# Patient Record
Sex: Male | Born: 1970 | Race: White | Hispanic: No | Marital: Married | Smoking: Never smoker
Health system: Southern US, Community
[De-identification: ages and names within clinical notes are randomized; demographics above are authoritative.]

## PROBLEM LIST (undated history)

## (undated) DIAGNOSIS — E291 Testicular hypofunction: Secondary | ICD-10-CM

## (undated) DIAGNOSIS — M069 Rheumatoid arthritis, unspecified: Secondary | ICD-10-CM

## (undated) HISTORY — DX: Rheumatoid arthritis, unspecified: M06.9

## (undated) HISTORY — DX: Testicular hypofunction: E29.1

## (undated) HISTORY — PX: NO PAST SURGERIES: SHX2092

---

## 2013-05-28 ENCOUNTER — Emergency Department: Payer: Self-pay | Admitting: Emergency Medicine

## 2013-10-12 ENCOUNTER — Ambulatory Visit: Payer: Self-pay

## 2014-08-11 ENCOUNTER — Telehealth: Payer: Self-pay

## 2014-08-11 NOTE — Telephone Encounter (Signed)
Pt has not been seen since 07/2013. Pt pharmacy sent a refill request for fortesta. I do not see recent labs either. Please advise. Cw,lpn

## 2014-08-17 NOTE — Telephone Encounter (Signed)
Patient will need an office visit prior to refilling his for test. Patient's need to be seen on an annual basis for med refills.

## 2014-08-18 NOTE — Telephone Encounter (Signed)
Spoke with pt in reference to fortesta. Pt stated he previously had quit taking medication due to daughter and age. Pt has decided to start taking medication again. Made pt aware he would need an appt before any refills could be given. Pt stated he would call back to make appt.

## 2016-03-01 ENCOUNTER — Ambulatory Visit: Payer: Self-pay | Admitting: Urology

## 2016-03-14 NOTE — Progress Notes (Signed)
03/15/2016 9:06 AM   Nicholas Sweeney Jan 08, 1971 [id number]  Referring provider: No referring provider defined for this encounter.  Chief Complaint  Patient presents with  . Hypogonadism    last seen 07/2013    HPI: Patient is a 46 year old Caucasian male with a history of hypogonadism, erectile dysfunction and BPH with L UTS who presents today to reestablish care.  Hypogonadism Patient is experiencing a decrease in libido, a lack of energy, a decrease in strength, a decreased enjoyment in life, sadness and/or grumpiness, falling asleep after dinner and recent deterioration in their work performance.  This is indicated by his responses to the ADAM questionnaire.  He is still having spontaneous erections at night.   He does not have sleep apnea.   He had been on topical gels in the past, but he found them cumbersome.  He has not had any testosterone therapy in over two years.   He felt like he had more energy and drive when he was on the testosterone.       Androgen Deficiency in the Aging Male    Row Name 03/15/16 0800         Androgen Deficiency in the Aging Male   Do you have a decrease in libido (sex drive) Yes     Do you have lack of energy Yes     Do you have a decrease in strength and/or endurance Yes     Have you lost height No     Have you noticed a decreased "enjoyment of life" Yes     Are you sad and/or grumpy Yes     Are your erections less strong No     Have you noticed a recent deterioration in your ability to play sports No     Are you falling asleep after dinner Yes     Has there been a recent deterioration in your work performance Yes       Erectile dysfunction His SHIM score is 23, which is no ED.   His libido is diminished.   His risk factors for ED are age, BPH and hypogonadism  He denies any painful erections or curvatures with his erections.        SHIM    Row Name 03/15/16 0855         SHIM: Over the last 6 months:   How do you rate your  confidence that you could get and keep an erection? High     When you had erections with sexual stimulation, how often were your erections hard enough for penetration (entering your partner)? Almost Always or Always     During sexual intercourse, how often were you able to maintain your erection after you had penetrated (entered) your partner? Not Difficult     During sexual intercourse, how difficult was it to maintain your erection to completion of intercourse? Not Difficult     When you attempted sexual intercourse, how often was it satisfactory for you? Slightly Difficult       SHIM Total Score   SHIM 23        Score: 1-7 Severe ED 8-11 Moderate ED 12-16 Mild-Moderate ED 17-21 Mild ED 22-25 No ED    BPH WITH LUTS His IPSS score today is 2, which is mild lower urinary tract symptomatology.  He is delighted with his quality life due to his urinary symptoms.   He denies any dysuria, hematuria or suprapubic pain.   He also denies any recent fevers, chills,  nausea or vomiting.   He does not have a family history of PCa.      IPSS    Row Name 03/15/16 0800         International Prostate Symptom Score   How often have you had the sensation of not emptying your bladder? Not at All     How often have you had to urinate less than every two hours? Less than 1 in 5 times     How often have you found you stopped and started again several times when you urinated? Not at All     How often have you found it difficult to postpone urination? Not at All     How often have you had a weak urinary stream? Not at All     How often have you had to strain to start urination? Not at All     How many times did you typically get up at night to urinate? 1 Time     Total IPSS Score 2       Quality of Life due to urinary symptoms   If you were to spend the rest of your life with your urinary condition just the way it is now how would you feel about that? Delighted        Score:  1-7 Mild 8-19  Moderate 20-35 Severe    PMH: Past Medical History:  Diagnosis Date  . Hypogonadism in male   . RA (rheumatoid arthritis) (HCC)     Surgical History: History reviewed. No pertinent surgical history.  Home Medications:  Allergies as of 03/15/2016   No Known Allergies     Medication List       Accurate as of 03/15/16  9:06 AM. Always use your most recent med list.          meloxicam 15 MG tablet Commonly known as:  MOBIC Take 15 mg by mouth daily.       Allergies: No Known Allergies  Family History: Family History  Problem Relation Age of Onset  . Benign prostatic hyperplasia Father   . Prostate cancer Neg Hx   . Kidney cancer Neg Hx   . Bladder Cancer Neg Hx     Social History:  reports that he has never smoked. He has never used smokeless tobacco. He reports that he drinks alcohol. He reports that he does not use drugs.  ROS: UROLOGY Frequent Urination?: No Hard to postpone urination?: No Burning/pain with urination?: No Get up at night to urinate?: No Leakage of urine?: No Urine stream starts and stops?: No Trouble starting stream?: No Do you have to strain to urinate?: No Blood in urine?: No Urinary tract infection?: No Sexually transmitted disease?: No Injury to kidneys or bladder?: No Painful intercourse?: No Weak stream?: No Erection problems?: No Penile pain?: No  Gastrointestinal Nausea?: No Vomiting?: No Indigestion/heartburn?: No Diarrhea?: No Constipation?: No  Constitutional Fever: No Night sweats?: No Weight loss?: No Fatigue?: No  Skin Skin rash/lesions?: No Itching?: No  Eyes Blurred vision?: No Double vision?: No  Ears/Nose/Throat Sore throat?: No Sinus problems?: No  Hematologic/Lymphatic Swollen glands?: No Easy bruising?: No  Cardiovascular Leg swelling?: No Chest pain?: No  Respiratory Cough?: No Shortness of breath?: No  Endocrine Excessive thirst?: No  Musculoskeletal Back pain?: No Joint  pain?: No  Neurological Headaches?: No Dizziness?: No  Psychologic Depression?: No Anxiety?: No  Physical Exam: BP 120/73   Pulse 67   Ht 5\' 11"  (1.803 m)  Wt 179 lb 1.6 oz (81.2 kg)   BMI 24.98 kg/m   Constitutional: Well nourished. Alert and oriented, No acute distress. HEENT: [place] AT, moist mucus membranes. Trachea midline, no masses. Cardiovascular: No clubbing, cyanosis, or edema. Respiratory: Normal respiratory effort, no increased work of breathing. GI: Abdomen is soft, non tender, non distended, no abdominal masses. Liver and spleen not palpable.  No hernias appreciated.  Stool sample for occult testing is not indicated.   GU: No CVA tenderness.  No bladder fullness or masses.  Patient with circumcised phallus.  Urethral meatus is patent.  No penile discharge. No penile lesions or rashes. Scrotum without lesions, cysts, rashes and/or edema.  Testicles are located scrotally bilaterally. No masses are appreciated in the testicles. Left and right epididymis are normal. Rectal: Patient with  normal sphincter tone. Anus and perineum without scarring or rashes. No rectal masses are appreciated. Prostate is approximately 45 grams, no nodules are appreciated. Seminal vesicles are normal. Skin: No rashes, bruises or suspicious lesions. Lymph: No cervical or inguinal adenopathy. Neurologic: Grossly intact, no focal deficits, moving all 4 extremities. Psychiatric: Normal mood and affect.  Laboratory Data: Assessment & Plan:    1. Hypogonadism  - I explained to patient that the current recommendations from the Endocrine Society reports the diagnosis of hypogonadism requires a serum total testosterone level obtained between 8 and 10 AM at least 2 days apart that is below the laboratory parameters  for normal testosterone and even though he may have a history of hypogonadism, we will need to reestablish the diagnosis of hypogonadism  - At this time, the patient does not meet this  requirement.  He will return for another morning serum testosterones in two days before 10 AM   -I reviewed with the patient the side effects of testosterone therapy, such as: enlargement of the prostate gland that may in turn cause LUTS, possible increased risk of PCa, DVT's and/or PE's, possible increased risk of heart attack or stroke, lower sperm count, swelling of the ankles, feet, or body, with or without heart failure, enlarged or painful breasts, have problems breathing while you sleep (sleep apnea), increased prostate specific antigen, mood swings, hypertension and increased red blood cell count.  - I also discussed that some men have had success using clomid for hypogonadism.  It does seem to be more successful in younger men, but there are incidences of good results in middle-aged men.  I explained that it is used in male infertility to stimulate the testicles to make more testosterone/sperm.  There has been no long term data on side effects, but some urologists has been having success with this medication.   - He would like to start the Clomid if appropriate   2. Erectile dysfunction  - SHIM score is 23  - I explained to the patient that in order to achieve an erection it takes good functioning of the nervous system (parasympathetic, sympathetic, sensory and motor), good blood flow into the erectile tissue of the penis and a desire to have sex  - I explained that conditions like diabetes, hypertension, coronary artery disease, peripheral vascular disease, smoking, alcohol consumption, age, sleep apnea and BPH can diminish the ability to have an erection  - RTC in 6 months for repeat SHIM score and exam   3. BPH with LUTS  - IPSS score is 2/0  - Continue conservative management, avoiding bladder irritants and timed voiding's  - RTC in 6months for IPSS, PSA and exam  Return for patient will RTC on Friday before 10 AM for testosterone draw only.  These notes generated with voice  recognition software. I apologize for typographical errors.  Michiel Cowboy, PA-C  [facility] [address], [address] [place], [place] [address] [phone]

## 2016-03-15 ENCOUNTER — Ambulatory Visit (INDEPENDENT_AMBULATORY_CARE_PROVIDER_SITE_OTHER): Payer: BLUE CROSS/BLUE SHIELD | Admitting: Urology

## 2016-03-15 ENCOUNTER — Encounter: Payer: Self-pay | Admitting: Urology

## 2016-03-15 VITALS — BP 120/73 | HR 67 | Ht 71.0 in | Wt 179.1 lb

## 2016-03-15 DIAGNOSIS — E291 Testicular hypofunction: Secondary | ICD-10-CM

## 2016-03-15 DIAGNOSIS — N529 Male erectile dysfunction, unspecified: Secondary | ICD-10-CM

## 2016-03-15 DIAGNOSIS — N138 Other obstructive and reflux uropathy: Secondary | ICD-10-CM

## 2016-03-15 DIAGNOSIS — N401 Enlarged prostate with lower urinary tract symptoms: Secondary | ICD-10-CM

## 2016-03-16 ENCOUNTER — Telehealth: Payer: Self-pay

## 2016-03-16 DIAGNOSIS — E291 Testicular hypofunction: Secondary | ICD-10-CM

## 2016-03-16 LAB — HEPATIC FUNCTION PANEL
ALT: 21 IU/L (ref 0–44)
AST: 17 IU/L (ref 0–40)
Albumin: 4.7 g/dL (ref 3.5–5.5)
Alkaline Phosphatase: 80 IU/L (ref 39–117)
BILIRUBIN TOTAL: 0.4 mg/dL (ref 0.0–1.2)
BILIRUBIN, DIRECT: 0.12 mg/dL (ref 0.00–0.40)
TOTAL PROTEIN: 7.3 g/dL (ref 6.0–8.5)

## 2016-03-16 LAB — PSA: Prostate Specific Ag, Serum: 0.7 ng/mL (ref 0.0–4.0)

## 2016-03-16 LAB — TESTOSTERONE: Testosterone: 291 ng/dL (ref 264–916)

## 2016-03-16 LAB — HEMATOCRIT: HEMATOCRIT: 43.7 % (ref 37.5–51.0)

## 2016-03-16 NOTE — Telephone Encounter (Signed)
-----   Message from Harle BattiestShannon A McGowan, PA-C sent at 03/16/2016  8:08 AM EST ----- Please notify the patient that his labs were normal.  His testosterone is also in the normal range.  He has a second testosterone draw scheduled and we will wait to see what that shows.

## 2016-03-16 NOTE — Telephone Encounter (Signed)
Spoke with pt in reference to lab results. Pt voiced understanding. Pt has a lab appt on Friday.

## 2016-03-18 ENCOUNTER — Other Ambulatory Visit: Payer: BLUE CROSS/BLUE SHIELD

## 2016-03-18 DIAGNOSIS — E291 Testicular hypofunction: Secondary | ICD-10-CM

## 2016-03-19 LAB — TESTOSTERONE: Testosterone: 204 ng/dL — ABNORMAL LOW (ref 264–916)

## 2016-03-21 ENCOUNTER — Telehealth: Payer: Self-pay

## 2016-03-21 ENCOUNTER — Other Ambulatory Visit: Payer: Self-pay

## 2016-03-21 DIAGNOSIS — E291 Testicular hypofunction: Secondary | ICD-10-CM

## 2016-03-21 NOTE — Telephone Encounter (Signed)
Patient notified of message and scheduled for a testosterone tomorrow at 8:30 am, order placed/SW

## 2016-03-21 NOTE — Telephone Encounter (Signed)
-----   Message from Harle BattiestShannon A McGowan, PA-C sent at 03/20/2016 10:39 AM EST ----- Please notify the patient that his second testosterone did result below the laboratories parameter for normal testosterone.  He would like to get another testosterone before 10 AM to see if this to is below the laboratories parameter for normal testosterone.

## 2016-03-22 ENCOUNTER — Other Ambulatory Visit: Payer: BLUE CROSS/BLUE SHIELD

## 2016-03-22 DIAGNOSIS — E291 Testicular hypofunction: Secondary | ICD-10-CM

## 2016-03-23 ENCOUNTER — Telehealth: Payer: Self-pay

## 2016-03-23 ENCOUNTER — Telehealth: Payer: Self-pay | Admitting: Urology

## 2016-03-23 DIAGNOSIS — E291 Testicular hypofunction: Secondary | ICD-10-CM

## 2016-03-23 LAB — TESTOSTERONE: Testosterone: 255 ng/dL — ABNORMAL LOW (ref 264–916)

## 2016-03-23 MED ORDER — CLOMIPHENE CITRATE 50 MG PO TABS: ORAL_TABLET | ORAL | 0 refills | Status: DC

## 2016-03-23 NOTE — Telephone Encounter (Signed)
Pt wife called stating that she works at [employer] and wanted to know why a PA was not completed for pt to receive clomid. Reinforced with wife that PA would/could be completed but was making pt aware of cash price from [facility]. Wife then inquired about why pt could not get another form of testosterone. Reinforced with wife that it is up to [facility] as if pt is able to receive other forms of testosterone but according to dictation he elected to try the clomid. Reinforced with wife that if another form of testosterone was chosen a PA would be completed but there was not guarantee insurance would cover medication. Wife then inquired about why pt would need to come into office for injections. Made wife aware that due to FDA regulations on testosterone and being a controlled substance. Wife then stated well percocets are also controlled substances and wanted to know if we made pts come in to take their pills. Made pt aware that we have adopted this policy as a BUA policy. Wife then stated "ok so its insurance fraud!" Wife then hung up.

## 2016-03-23 NOTE — Telephone Encounter (Signed)
Pt said Clomid was not covered by insurance.  Please give pt a call.  [phone]

## 2016-03-23 NOTE — Telephone Encounter (Signed)
I really don't understand Mrs. Stettner concerns as I had sent the prescription in for her husband this morning and I have not received a PA request for this medication.  Also, I had discussed with her husband the different types of treatments for testosterone and he was interested in trying Clomid therapy.

## 2016-03-23 NOTE — Telephone Encounter (Signed)
Spoke with pt in reference to clomid. Made pt aware of [facility] pharmacy prices. Pt inquired about other forms of testosterone. Please advise.

## 2016-03-23 NOTE — Telephone Encounter (Signed)
Spoke with pt in reference to lab results. Pt elected to start clomid. Medication was sent to pharmacy. Made pt aware will need labs after 27mo of medication. Pt voiced understanding.

## 2016-03-23 NOTE — Telephone Encounter (Signed)
As discussed during our office visit, Clomid is not testosterone.  For a quick explanation, it stimulates sperm and testosterone production in your testicles.   I have prescribed this medication for several men over the last ten years and had good results.  The advantage of this medication is that it uses your own testicles to make testosterone.  The disadvantage is if your testicles are "used up" the Clomid will not be effective.  There are gels, patches, short acting injections, long acting injections, buccal systems, nasal spray and pellets.   The side effects of all these therapies are they suppress the body's natural testosterone production, enlargement of the prostate gland that may in turn cause LUTS, possible increased risk of PCa, DVT's and/or PE's, possible increased risk of heart attack or stroke, lower sperm count, swelling of the ankles, feet, or body, with or without heart failure, enlarged or painful breasts, have problems breathing while you sleep (sleep apnea), increased prostate specific antigen, mood swings, hypertension and increased red blood cell count.  Specifically, the gels and patches have the risk of transferring testosterone to women and children's when they come into contact with your skin.  The injections of testosterone in my clinical experience have cysts patient's hematocrit at a greater rate than the other treatment modalities causing the patient to have to stop testosterone therapy or undergo therapeutic phlebotomy.  To be honest the buccal systems was not well received and I have not prescribed this treatment modality in several years. I have very few individuals on the nasal spray, but it does have the advantage in some gentlemen where topicals have not been effective as it bypasses the skin.  I do have a fair bit of men on the pellets as it is convenient treatment. It bypasses the skin metabolism and men do not have to think about applying the medication on a daily basis.   There are 6 pellets the size of the tic tacks placed into the fat tissue in the upper corner of the hip on a schedule of every 90 days.  But ultimately, I have found that testosterone treatment is insurance driven and so it is dependent upon the patient's insurance coverage and formulary preferences to which type of testosterone treatment and they receive.

## 2016-03-23 NOTE — Telephone Encounter (Signed)
-----   Message from Harle BattiestShannon A McGowan, PA-C sent at 03/23/2016  8:11 AM EST ----- Please notify the patient that his second testosterone is below the laboratory parameters for normal.   Does he want to start Clomid at this time?

## 2016-03-24 MED ORDER — TESTOSTERONE CYPIONATE 200 MG/ML IM SOLN: 200.0000 mg | INTRAMUSCULAR | 0 refills | Status: DC

## 2016-03-24 NOTE — Telephone Encounter (Signed)
Okay to call in script for testosterone cypionate, 200mg /1 mL, 200 mg IM every two weeks.  Recheck testosterone one week after fourth injection.  Schedule injections on nurse schedule.

## 2016-03-24 NOTE — Telephone Encounter (Signed)
Pt returned the call apologizing for the misunderstanding. Pt elected to move forward with injections. See previous note.

## 2016-03-24 NOTE — Telephone Encounter (Signed)
Pt called back in reference to forms of testosterone. Pt stated that she is not sure the clomid is going to work the way he would like for it to. Therefore he would like to have the injections. Reinforced with pt the possible side effects of injections. Pt voiced understanding. Please advise.

## 2016-03-24 NOTE — Telephone Encounter (Signed)
Medication sent to pharmacy. Will complete PA once its received.

## 2016-03-30 ENCOUNTER — Encounter: Payer: Self-pay | Admitting: Urology

## 2016-04-06 NOTE — Telephone Encounter (Signed)
Pt's wife, Rodney Boozeasha, called this morning asking about the prior authorization for testosterone shots.  Please give her a call [phone].

## 2016-04-07 NOTE — Telephone Encounter (Signed)
Spoke to patient's wife, Rodney Boozeasha [phone]), regarding the prescription for testosterone.  She expressed her concerns as to why it was taking so long for him to get his prescription.  I confirmed that the prescription [facility] [place] on [facility].    I contacted the pharmacy and they ran the prescription thru insurance and it processed for $10.  They will prepare the prescription for the patient to pick up.    I called the patient's wife back and left a message for the patient's wife regarding the prescription.  I advised her to call back to the office to schedule nurse visits for testosterone injections per Shannon's instructions.

## 2016-04-08 ENCOUNTER — Ambulatory Visit (INDEPENDENT_AMBULATORY_CARE_PROVIDER_SITE_OTHER): Payer: BLUE CROSS/BLUE SHIELD

## 2016-04-08 DIAGNOSIS — E291 Testicular hypofunction: Secondary | ICD-10-CM | POA: Diagnosis not present

## 2016-04-08 MED ORDER — TESTOSTERONE CYPIONATE 200 MG/ML IM SOLN
200.0000 mg | Freq: Once | INTRAMUSCULAR | Status: AC
  Administered 2016-04-08: 200 mg via INTRAMUSCULAR

## 2016-04-08 NOTE — Progress Notes (Signed)
Testosterone IM Injection  Due to Hypogonadism patient is present today for a Testosterone Injection.  Medication: Testosterone Cypionate Dose: 1mL Location: right upper outer buttocks Lot: [id number] Exp:12/2017  Patient tolerated well, no complications were noted  Preformed by: Marquinn Meschke, LPN   Follow up: 2 weeks  

## 2016-04-22 ENCOUNTER — Ambulatory Visit (INDEPENDENT_AMBULATORY_CARE_PROVIDER_SITE_OTHER): Payer: BLUE CROSS/BLUE SHIELD

## 2016-04-22 DIAGNOSIS — E291 Testicular hypofunction: Secondary | ICD-10-CM | POA: Diagnosis not present

## 2016-04-22 MED ORDER — TESTOSTERONE CYPIONATE 200 MG/ML IM SOLN
200.0000 mg | Freq: Once | INTRAMUSCULAR | Status: AC
  Administered 2016-04-22: 200 mg via INTRAMUSCULAR

## 2016-04-22 NOTE — Progress Notes (Signed)
Testosterone IM Injection  Due to Hypogonadism patient is present today for a Testosterone Injection.  Medication: Testosterone Cypionate Dose: 1mL Location: left upper outer buttocks Lot: [id number] Exp:12/2017  Patient tolerated well, no complications were noted  Preformed by: Linh Johannes, LPN   Follow up: 2 weeks  

## 2016-05-04 ENCOUNTER — Ambulatory Visit (INDEPENDENT_AMBULATORY_CARE_PROVIDER_SITE_OTHER): Payer: BLUE CROSS/BLUE SHIELD

## 2016-05-04 DIAGNOSIS — E291 Testicular hypofunction: Secondary | ICD-10-CM | POA: Diagnosis not present

## 2016-05-04 MED ORDER — TESTOSTERONE CYPIONATE 200 MG/ML IM SOLN
200.0000 mg | Freq: Once | INTRAMUSCULAR | Status: AC
  Administered 2016-05-04: 200 mg via INTRAMUSCULAR

## 2016-05-04 NOTE — Progress Notes (Signed)
Testosterone IM Injection  Due to Hypogonadism patient is present today for a Testosterone Injection.  Medication: Testosterone Cypionate Dose: 1mL Location: right upper outer buttocks Lot: [id number] Exp:12/2017  Patient tolerated well, no complications were noted  Preformed by: Chelsea Watkins, LPN   Follow up: 2 weeks  

## 2016-05-20 ENCOUNTER — Ambulatory Visit (INDEPENDENT_AMBULATORY_CARE_PROVIDER_SITE_OTHER): Payer: BLUE CROSS/BLUE SHIELD

## 2016-05-20 DIAGNOSIS — E291 Testicular hypofunction: Secondary | ICD-10-CM | POA: Diagnosis not present

## 2016-05-20 MED ORDER — TESTOSTERONE CYPIONATE 200 MG/ML IM SOLN
200.0000 mg | Freq: Once | INTRAMUSCULAR | Status: AC
  Administered 2016-05-20: 200 mg via INTRAMUSCULAR

## 2016-05-20 NOTE — Progress Notes (Signed)
Testosterone IM Injection  Due to Hypogonadism patient is present today for a Testosterone Injection.  Medication: Testosterone Cypionate Dose: 1mL Location: left upper outer buttocks Lot: [id number] Exp:12/2017  Patient tolerated well, no complications were noted  Preformed by: Rupert Stacks, LPN   Follow up: pt will have labs next week

## 2016-05-27 ENCOUNTER — Other Ambulatory Visit: Payer: Self-pay

## 2016-05-27 ENCOUNTER — Other Ambulatory Visit: Payer: BLUE CROSS/BLUE SHIELD

## 2016-05-27 DIAGNOSIS — E291 Testicular hypofunction: Secondary | ICD-10-CM

## 2016-05-28 LAB — TESTOSTERONE: Testosterone: 667 ng/dL (ref 264–916)

## 2016-05-30 ENCOUNTER — Telehealth: Payer: Self-pay

## 2016-05-30 NOTE — Telephone Encounter (Signed)
Spoke with pt in reference to lab results. Pt voiced understanding.  

## 2016-05-30 NOTE — Telephone Encounter (Signed)
-----   Message from Harle Battiest, PA-C sent at 05/28/2016 11:22 AM EDT ----- Please let Mr. Domagalski know that his testosterone level is excellent at 667.  We will continue with testosterone cypionate 200 mg IM every 2 weeks.  I will need to see him in five months for ADAM, I PSS, SHIM and exam.  He will need a testosterone (one week after the injection), HCT, HBG, PSA to be drawn before appointment

## 2016-06-03 ENCOUNTER — Ambulatory Visit (INDEPENDENT_AMBULATORY_CARE_PROVIDER_SITE_OTHER): Payer: BLUE CROSS/BLUE SHIELD

## 2016-06-03 DIAGNOSIS — E291 Testicular hypofunction: Secondary | ICD-10-CM | POA: Diagnosis not present

## 2016-06-03 MED ORDER — TESTOSTERONE CYPIONATE 200 MG/ML IM SOLN
200.0000 mg | Freq: Once | INTRAMUSCULAR | Status: AC
  Administered 2016-06-03: 200 mg via INTRAMUSCULAR

## 2016-06-03 NOTE — Progress Notes (Signed)
Testosterone IM Injection  Due to Hypogonadism patient is present today for a Testosterone Injection.  Medication: Testosterone Cypionate Dose: 1mL Location: right upper outer buttocks Lot: [id number] Exp:12/2017  Patient tolerated well, no complications were noted  Preformed by: Rupert Stackshelsea Armentha Branagan, LPN   Follow up: 2 weeks

## 2016-06-10 ENCOUNTER — Other Ambulatory Visit: Payer: BLUE CROSS/BLUE SHIELD

## 2016-06-17 ENCOUNTER — Ambulatory Visit (INDEPENDENT_AMBULATORY_CARE_PROVIDER_SITE_OTHER): Payer: BLUE CROSS/BLUE SHIELD

## 2016-06-17 DIAGNOSIS — E291 Testicular hypofunction: Secondary | ICD-10-CM

## 2016-06-17 MED ORDER — TESTOSTERONE CYPIONATE 200 MG/ML IM SOLN
200.0000 mg | Freq: Once | INTRAMUSCULAR | Status: AC
  Administered 2016-06-17: 200 mg via INTRAMUSCULAR

## 2016-06-17 NOTE — Progress Notes (Signed)
Testosterone IM Injection  Due to Hypogonadism patient is present today for a Testosterone Injection.  Medication: Testosterone Cypionate Dose: 1mL Location: left upper outer buttocks Lot: [id number] Exp:12/2017  Patient tolerated well, no complications were noted  Preformed by: Rupert Stackshelsea Watkins, LPN   Follow up: 2 weeks

## 2016-07-01 ENCOUNTER — Ambulatory Visit (INDEPENDENT_AMBULATORY_CARE_PROVIDER_SITE_OTHER): Payer: BLUE CROSS/BLUE SHIELD

## 2016-07-01 DIAGNOSIS — E291 Testicular hypofunction: Secondary | ICD-10-CM

## 2016-07-01 MED ORDER — TESTOSTERONE CYPIONATE 200 MG/ML IM SOLN
200.0000 mg | Freq: Once | INTRAMUSCULAR | Status: AC
  Administered 2016-07-01: 200 mg via INTRAMUSCULAR

## 2016-07-01 NOTE — Progress Notes (Signed)
Testosterone IM Injection  Due to Hypogonadism patient is present today for a Testosterone Injection.  Medication: Testosterone Cypionate Dose: 1ml Location: left upper outer buttocks Lot: [id number]  Exp:01/2018  Patient tolerated well, no complications were noted  Preformed by: C.Taquilla Downum, CMA  Follow up: 2 weeks 

## 2016-07-15 ENCOUNTER — Ambulatory Visit (INDEPENDENT_AMBULATORY_CARE_PROVIDER_SITE_OTHER): Payer: BLUE CROSS/BLUE SHIELD | Admitting: *Deleted

## 2016-07-15 VITALS — BP 118/77 | HR 58 | Wt 181.0 lb

## 2016-07-15 DIAGNOSIS — E291 Testicular hypofunction: Secondary | ICD-10-CM | POA: Diagnosis not present

## 2016-07-15 MED ORDER — TESTOSTERONE CYPIONATE 200 MG/ML IM SOLN
200.0000 mg | Freq: Once | INTRAMUSCULAR | Status: AC
  Administered 2016-07-15: 200 mg via INTRAMUSCULAR

## 2016-07-15 NOTE — Progress Notes (Signed)
Testosterone IM Injection  Due to Hypogonadism patient is present today for a Testosterone Injection.  Medication: Testosterone Cypionate Dose: 1 ml  200mg  Location: right upper outer buttocks Lot: [id number] Exp:01/2018  Patient tolerated well, no complications were noted.  Preformed by: Ashley Marineramona William CMA  Follow up: 2 weeks

## 2016-07-29 ENCOUNTER — Ambulatory Visit (INDEPENDENT_AMBULATORY_CARE_PROVIDER_SITE_OTHER): Payer: BLUE CROSS/BLUE SHIELD

## 2016-07-29 DIAGNOSIS — E291 Testicular hypofunction: Secondary | ICD-10-CM | POA: Diagnosis not present

## 2016-07-29 MED ORDER — TESTOSTERONE CYPIONATE 200 MG/ML IM SOLN
200.0000 mg | Freq: Once | INTRAMUSCULAR | Status: AC
  Administered 2016-07-29: 200 mg via INTRAMUSCULAR

## 2016-07-29 NOTE — Progress Notes (Signed)
Testosterone IM Injection  Due to Hypogonadism patient is present today for a Testosterone Injection.  Medication: Testosterone Cypionate Dose: 1 ml Location: left upper outer buttocks Lot: [id number] Exp:01/2018  Patient tolerated well, no complications were noted  Performed by: C. Rana SnareLowe, CMA  Follow up: 2 weeks

## 2016-08-12 ENCOUNTER — Ambulatory Visit (INDEPENDENT_AMBULATORY_CARE_PROVIDER_SITE_OTHER): Payer: BLUE CROSS/BLUE SHIELD | Admitting: *Deleted

## 2016-08-12 DIAGNOSIS — E291 Testicular hypofunction: Secondary | ICD-10-CM | POA: Diagnosis not present

## 2016-08-12 MED ORDER — TESTOSTERONE CYPIONATE 200 MG/ML IM SOLN
200.0000 mg | Freq: Once | INTRAMUSCULAR | Status: AC
  Administered 2016-08-12: 200 mg via INTRAMUSCULAR

## 2016-08-12 NOTE — Progress Notes (Signed)
Testosterone IM Injection  Due to Hypogonadism patient is present today for a Testosterone Injection.  Medication: Testosterone Cypionate Dose: 1ml Location: right upper outer buttocks Lot: [id number] Exp:01/2018  Patient tolerated well, no complications were noted.  Preformed by: Dallas Schimkeamona Williams CMA  Follow up: 2 weeks

## 2016-08-23 ENCOUNTER — Other Ambulatory Visit: Payer: Self-pay | Admitting: Urology

## 2016-08-23 DIAGNOSIS — E291 Testicular hypofunction: Secondary | ICD-10-CM

## 2016-08-26 ENCOUNTER — Ambulatory Visit (INDEPENDENT_AMBULATORY_CARE_PROVIDER_SITE_OTHER): Payer: BLUE CROSS/BLUE SHIELD

## 2016-08-26 DIAGNOSIS — E291 Testicular hypofunction: Secondary | ICD-10-CM

## 2016-08-26 MED ORDER — TESTOSTERONE CYPIONATE 200 MG/ML IM SOLN
200.0000 mg | Freq: Once | INTRAMUSCULAR | Status: AC
  Administered 2016-08-26: 200 mg via INTRAMUSCULAR

## 2016-08-26 NOTE — Progress Notes (Signed)
Testosterone IM Injection  Due to Hypogonadism patient is present today for a Testosterone Injection.  Medication: Testosterone Cypionate Dose: 1 ml Location: left upper outer buttocks Lot: [id number] Exp:01/2018  Patient tolerated well, no complications were noted  Preformed by: C. Myda Detwiler, CMA   Follow up: 2 weeks  

## 2016-09-09 ENCOUNTER — Ambulatory Visit (INDEPENDENT_AMBULATORY_CARE_PROVIDER_SITE_OTHER): Payer: BLUE CROSS/BLUE SHIELD | Admitting: *Deleted

## 2016-09-09 DIAGNOSIS — E291 Testicular hypofunction: Secondary | ICD-10-CM | POA: Diagnosis not present

## 2016-09-09 MED ORDER — TESTOSTERONE CYPIONATE 200 MG/ML IM SOLN
200.0000 mg | Freq: Once | INTRAMUSCULAR | Status: AC
  Administered 2016-09-09: 200 mg via INTRAMUSCULAR

## 2016-09-09 NOTE — Progress Notes (Signed)
Testosterone IM Injection  Due to Hypogonadism patient is present today for a Testosterone Injection.  Medication: Testosterone Cypionate Dose: 1ml Location: right upper outer buttocks Lot: [id number] Exp:01/2018  Patient tolerated well, no complications were noted  Preformed by: Dallas Schimkeamona Jessicamarie Amiri CMA  Follow up: 2 weeks

## 2016-09-20 ENCOUNTER — Telehealth: Payer: Self-pay | Admitting: Urology

## 2016-09-20 ENCOUNTER — Ambulatory Visit (INDEPENDENT_AMBULATORY_CARE_PROVIDER_SITE_OTHER): Payer: BLUE CROSS/BLUE SHIELD

## 2016-09-20 DIAGNOSIS — E291 Testicular hypofunction: Secondary | ICD-10-CM

## 2016-09-20 MED ORDER — TESTOSTERONE CYPIONATE 200 MG/ML IM SOLN
200.0000 mg | Freq: Once | INTRAMUSCULAR | Status: AC
  Administered 2016-09-20: 200 mg via INTRAMUSCULAR

## 2016-09-20 NOTE — Telephone Encounter (Signed)
Spoke with pt in reference to needing labs and OV prior to next injection. Pt voiced understanding. Pt stated he is at work and will call back when he gets off. Lab orders placed.

## 2016-09-20 NOTE — Progress Notes (Signed)
Testosterone IM Injection  Due to Hypogonadism patient is present today for a Testosterone Injection.  Medication: Testosterone Cypionate Dose: 62mL Location: left upper outer buttocks Lot: [id number] Exp:01/2018  Patient tolerated well, no complications were noted  Preformed by: Rupert Stacks, LPN   Follow up: 2 weeks

## 2016-09-20 NOTE — Telephone Encounter (Signed)
Mr. Denno will need an appointment with me prior to his next injection.  Testosterone, HCT/HBG, PSA to be drawn before appointment

## 2016-09-23 ENCOUNTER — Ambulatory Visit: Payer: BLUE CROSS/BLUE SHIELD

## 2016-10-07 ENCOUNTER — Ambulatory Visit (INDEPENDENT_AMBULATORY_CARE_PROVIDER_SITE_OTHER): Payer: BLUE CROSS/BLUE SHIELD

## 2016-10-07 DIAGNOSIS — E291 Testicular hypofunction: Secondary | ICD-10-CM | POA: Diagnosis not present

## 2016-10-07 MED ORDER — TESTOSTERONE CYPIONATE 200 MG/ML IM SOLN
200.0000 mg | Freq: Once | INTRAMUSCULAR | Status: AC
  Administered 2016-10-07: 200 mg via INTRAMUSCULAR

## 2016-10-07 NOTE — Progress Notes (Signed)
Testosterone IM Injection  Due to Hypogonadism patient is present today for a Testosterone Injection.  Medication: Testosterone Cypionate Dose: 1mL Location: right upper outer buttocks Lot: [id number] Exp:01/2018  Patient tolerated well, no complications were noted  Preformed by: Rupert Stackshelsea Watkins, LPN   Follow up: pt set up lab appt for next week and OV with Carollee HerterShannon in 2 weeks

## 2016-10-14 ENCOUNTER — Other Ambulatory Visit: Payer: BLUE CROSS/BLUE SHIELD

## 2016-10-14 ENCOUNTER — Ambulatory Visit: Payer: BLUE CROSS/BLUE SHIELD

## 2016-10-14 DIAGNOSIS — E291 Testicular hypofunction: Secondary | ICD-10-CM

## 2016-10-15 LAB — PSA: Prostate Specific Ag, Serum: 1 ng/mL (ref 0.0–4.0)

## 2016-10-15 LAB — TESTOSTERONE: Testosterone: 574 ng/dL (ref 264–916)

## 2016-10-15 LAB — HEMATOCRIT: HEMATOCRIT: 45.4 % (ref 37.5–51.0)

## 2016-10-15 LAB — HEMOGLOBIN: Hemoglobin: 16 g/dL (ref 13.0–17.7)

## 2016-10-18 NOTE — Progress Notes (Signed)
10/19/2016 8:53 AM   Nicholas Sweeney [birth year] [id number]  Referring provider: No referring provider defined for this encounter.  Chief Complaint  Patient presents with  . Hypogonadism    2 month follow up    HPI: Patient is a 46 year old Caucasian male with a history of testosterone deficiency, erectile dysfunction and BPH with L UTS who presents today for 6 month follow-up.  Testosterone deficiency Patient is not experiencing a decrease in libido, a lack of energy, a decrease in strength, all loss of height, a decreased enjoyment in life, sadness and/or grumpiness, his erections being less strong, a decrease in his ability to play sports, falling asleep after dinner and recent deterioration in their work performance.  This is indicated by his responses to the ADAM questionnaire.  He is still having spontaneous erections at night.   He does not have sleep apnea.   He had been on topical gels in the past, but he found them cumbersome.  His current testosterone level is 574 ng/dL on 40/98/1191. His hematocrit and hemoglobin are normal. He is currently managing his testosterone deficiency with testosterone cypionate 200 mg/cc, 1 mL every 2 weeks.       Androgen Deficiency in the Aging Male    Row Name 10/19/16 0800         Androgen Deficiency in the Aging Male   Do you have a decrease in libido (sex drive) No     Do you have lack of energy No     Do you have a decrease in strength and/or endurance No     Have you lost height No     Have you noticed a decreased "enjoyment of life" No     Are you sad and/or grumpy No     Are your erections less strong No     Have you noticed a recent deterioration in your ability to play sports No     Are you falling asleep after dinner No     Has there been a recent deterioration in your work performance No       Erectile dysfunction His SHIM score is 24, which is no ED.   His previous SHIM score was 23.  His libido is diminished.   His  risk factors for ED are age, BPH and hypogonadism  He denies any painful erections or curvatures with his erections.        SHIM    Row Name 10/19/16 0845         SHIM: Over the last 6 months:   How do you rate your confidence that you could get and keep an erection? High     When you had erections with sexual stimulation, how often were your erections hard enough for penetration (entering your partner)? Almost Always or Always     During sexual intercourse, how often were you able to maintain your erection after you had penetrated (entered) your partner? Almost Always or Always     During sexual intercourse, how difficult was it to maintain your erection to completion of intercourse? Not Difficult     When you attempted sexual intercourse, how often was it satisfactory for you? Almost Always or Always       SHIM Total Score   SHIM 24        Score: 1-7 Severe ED 8-11 Moderate ED 12-16 Mild-Moderate ED 17-21 Mild ED 22-25 No ED    BPH WITH LUTS His IPSS score today is 3, which is  mild lower urinary tract symptomatology.  He is delighted with his quality life due to his urinary symptoms.   His previous I PSS score was 2/0.  He denies any dysuria, hematuria or suprapubic pain.   He also denies any recent fevers, chills, nausea or vomiting.   He does not have a family history of PCa.      IPSS    Row Name 10/19/16 0800         International Prostate Symptom Score   How often have you had the sensation of not emptying your bladder? Not at All     How often have you had to urinate less than every two hours? Less than 1 in 5 times     How often have you found you stopped and started again several times when you urinated? Not at All     How often have you found it difficult to postpone urination? Not at All     How often have you had a weak urinary stream? Not at All     How often have you had to strain to start urination? Not at All     How many times did you typically get up  at night to urinate? 2 Times     Total IPSS Score 3       Quality of Life due to urinary symptoms   If you were to spend the rest of your life with your urinary condition just the way it is now how would you feel about that? Delighted        Score:  1-7 Mild 8-19 Moderate 20-35 Severe    PMH: Past Medical History:  Diagnosis Date  . Hypogonadism in male   . RA (rheumatoid arthritis) (HCC)     Surgical History: History reviewed. No pertinent surgical history.  Home Medications:  Allergies as of 10/19/2016      Reactions   No Known Allergies       Medication List       Accurate as of 10/19/16  8:53 AM. Always use your most recent med list.          azithromycin 250 MG tablet Commonly known as:  ZITHROMAX azithromycin 250 mg tablet   cefdinir 300 MG capsule Commonly known as:  OMNICEF cefdinir 300 mg capsule   clindamycin 300 MG capsule Commonly known as:  CLEOCIN clindamycin HCl 300 mg capsule   clomiPHENE 50 MG tablet Commonly known as:  CLOMID 1/2 tab daily   levofloxacin 500 MG tablet Commonly known as:  LEVAQUIN levofloxacin 500 mg tablet   meloxicam 15 MG tablet Commonly known as:  MOBIC Take 15 mg by mouth daily.   predniSONE 10 MG (21) Tbpk tablet Commonly known as:  STERAPRED UNI-PAK 21 TAB prednisone 10 mg tablets in a dose pack   Testosterone 10 MG/ACT (2%) Gel testosterone 10 mg/0.5 gram/actuation transdermal gel pump   testosterone cypionate 200 MG/ML injection Commonly known as:  DEPOTESTOSTERONE CYPIONATE INJECT 1 ML INTO THE MUSCLE EVERY 14 DAYS       Allergies:  Allergies  Allergen Reactions  . No Known Allergies     Family History: Family History  Problem Relation Age of Onset  . Benign prostatic hyperplasia Father   . Prostate cancer Neg Hx   . Kidney cancer Neg Hx   . Bladder Cancer Neg Hx     Social History:  reports that he has never smoked. He has never used smokeless tobacco. He reports that he  drinks  alcohol. He reports that he does not use drugs.  ROS: UROLOGY Frequent Urination?: No Hard to postpone urination?: No Burning/pain with urination?: No Get up at night to urinate?: No Leakage of urine?: No Urine stream starts and stops?: No Trouble starting stream?: No Do you have to strain to urinate?: No Blood in urine?: No Urinary tract infection?: No Sexually transmitted disease?: No Injury to kidneys or bladder?: No Painful intercourse?: No Weak stream?: No Erection problems?: No Penile pain?: No  Gastrointestinal Nausea?: No Vomiting?: No Indigestion/heartburn?: No Diarrhea?: No Constipation?: No  Constitutional Fever: No Night sweats?: No Weight loss?: No Fatigue?: No  Skin Skin rash/lesions?: No Itching?: No  Eyes Blurred vision?: No Double vision?: No  Ears/Nose/Throat Sore throat?: No Sinus problems?: No  Hematologic/Lymphatic Swollen glands?: No Easy bruising?: No  Cardiovascular Leg swelling?: No Chest pain?: No  Respiratory Cough?: No Shortness of breath?: No  Endocrine Excessive thirst?: No  Musculoskeletal Back pain?: No Joint pain?: No  Neurological Headaches?: No Dizziness?: No  Psychologic Depression?: No Anxiety?: No  Physical Exam: BP 126/84   Pulse 66   Ht  (1.803 m)   Wt 183 lb 3.2 oz (83.1 kg)   BMI 25.55 kg/m   Constitutional: Well nourished. Alert and oriented, No acute distress. HEENT: [place] AT, moist mucus membranes. Trachea midline, no masses. Cardiovascular: No clubbing, cyanosis, or edema. Respiratory: Normal respiratory effort, no increased work of breathing. GI: Abdomen is soft, non tender, non distended, no abdominal masses. Liver and spleen not palpable.  No hernias appreciated.  Stool sample for occult testing is not indicated.   GU: No CVA tenderness.  No bladder fullness or masses.  Patient with circumcised phallus.  Urethral meatus is patent.  No penile discharge. No penile lesions or  rashes. Scrotum without lesions, cysts, rashes and/or edema.  Testicles are located scrotally bilaterally. No masses are appreciated in the testicles. Left and right epididymis are normal. Rectal: Patient with  normal sphincter tone. Anus and perineum without scarring or rashes. No rectal masses are appreciated. Prostate is approximately 45 grams, no nodules are appreciated. Seminal vesicles are normal. Skin: No rashes, bruises or suspicious lesions. Lymph: No cervical or inguinal adenopathy. Neurologic: Grossly intact, no focal deficits, moving all 4 extremities. Psychiatric: Normal mood and affect.  Laboratory Data: PSA History  0.7 ng/mL on 03/15/2016  1.0 ng/mL on 10/14/2016     Assessment & Plan:    1. Testosterone deficiency   -most recent testosterone level is 574 ng/dL on 40/98/1191 (goal 478-295 ng/dL)  -continue testosterone cypionate 200 mg, 1 mL IM Q2 weeks  -RTC in 6 months for HCT/HBG, testosterone, ADAM and exam  2. BPH with LUTS  - IPSS score is 3/0, it is worsening  - Continue conservative management, avoiding bladder irritants and timed voiding's  - RTC in 6 months for IPSS, PSA and exam, as testosterone therapy can cause prostate enlargement and worsen LUTS  3. Erectile dysfunction:     -SHIM score is 24, it is improved  - RTC in 6 months for SHIM score and exam, as testosterone therapy can affect erections    Return in about 6 months (around 04/18/2017) for PSA,Testoterone and  HCT/HBG, ADAM, IPSS, SHIM and exam.  These notes generated with voice recognition software. I apologize for typographical errors.  Michiel Cowboy, PA-C  [facility] [address] [place], [place] [address] [phone]

## 2016-10-19 ENCOUNTER — Ambulatory Visit (INDEPENDENT_AMBULATORY_CARE_PROVIDER_SITE_OTHER): Payer: BLUE CROSS/BLUE SHIELD | Admitting: Urology

## 2016-10-19 ENCOUNTER — Encounter: Payer: Self-pay | Admitting: Urology

## 2016-10-19 VITALS — BP 126/84 | HR 66 | Ht 71.0 in | Wt 183.2 lb

## 2016-10-19 DIAGNOSIS — N138 Other obstructive and reflux uropathy: Secondary | ICD-10-CM

## 2016-10-19 DIAGNOSIS — N401 Enlarged prostate with lower urinary tract symptoms: Secondary | ICD-10-CM | POA: Diagnosis not present

## 2016-10-19 DIAGNOSIS — N529 Male erectile dysfunction, unspecified: Secondary | ICD-10-CM | POA: Diagnosis not present

## 2016-10-19 DIAGNOSIS — E349 Endocrine disorder, unspecified: Secondary | ICD-10-CM

## 2016-10-19 MED ORDER — TESTOSTERONE CYPIONATE 200 MG/ML IM SOLN
200.0000 mg | Freq: Once | INTRAMUSCULAR | Status: AC
  Administered 2016-10-19: 200 mg via INTRAMUSCULAR

## 2016-10-19 NOTE — Progress Notes (Signed)
Testosterone IM Injection  Due to Hypogonadism patient is present today for a Testosterone Injection.  Medication: Testosterone Cypionate Dose: 1ml Location: left upper outer buttocks Lot: [id number] Exp:01/2018  Patient tolerated well, no complications were noted  Preformed by: Dallas Schimke CMA  Follow up: Oct 5th for next injection per Carollee Herter no sooner.

## 2016-10-19 NOTE — Addendum Note (Signed)
Addended by: Mervin Kung on: 10/19/2016 09:11 AM   Modules accepted: Orders

## 2016-10-21 ENCOUNTER — Ambulatory Visit: Payer: BLUE CROSS/BLUE SHIELD

## 2016-11-04 ENCOUNTER — Ambulatory Visit (INDEPENDENT_AMBULATORY_CARE_PROVIDER_SITE_OTHER): Payer: BLUE CROSS/BLUE SHIELD

## 2016-11-04 DIAGNOSIS — E291 Testicular hypofunction: Secondary | ICD-10-CM

## 2016-11-04 MED ORDER — TESTOSTERONE CYPIONATE 200 MG/ML IM SOLN
200.0000 mg | Freq: Once | INTRAMUSCULAR | Status: AC
  Administered 2016-11-04: 200 mg via INTRAMUSCULAR

## 2016-11-04 NOTE — Progress Notes (Signed)
Testosterone IM Injection  Due to Hypogonadism patient is present today for a Testosterone Injection.  Medication: Testosterone Cypionate Dose: 1mL Location: right upper outer buttocks Lot: [id number] Exp:01/2018  Patient tolerated well, no complications were noted  Preformed by: Rupert Stacks, LPN   Follow up: Pt made aware he is out of medication. Pt will f/u in 2 weeks.

## 2016-11-18 ENCOUNTER — Ambulatory Visit (INDEPENDENT_AMBULATORY_CARE_PROVIDER_SITE_OTHER): Payer: BLUE CROSS/BLUE SHIELD

## 2016-11-18 DIAGNOSIS — E291 Testicular hypofunction: Secondary | ICD-10-CM | POA: Diagnosis not present

## 2016-11-18 MED ORDER — TESTOSTERONE CYPIONATE 200 MG/ML IM SOLN
200.0000 mg | Freq: Once | INTRAMUSCULAR | Status: AC
  Administered 2016-11-18: 200 mg via INTRAMUSCULAR

## 2016-11-18 NOTE — Progress Notes (Signed)
Testosterone IM Injection  Due to Hypogonadism patient is present today for a Testosterone Injection.  Medication: Testosterone Cypionate Dose: 1 ml Location: left upper outer buttocks Lot: [id number] Exp:01/2018  Patient tolerated well, no complications were noted  Preformed by: C. Rana SnareLowe, CMA   Follow up: F/u in 2 weeks

## 2016-12-02 ENCOUNTER — Ambulatory Visit (INDEPENDENT_AMBULATORY_CARE_PROVIDER_SITE_OTHER): Payer: BLUE CROSS/BLUE SHIELD

## 2016-12-02 DIAGNOSIS — E291 Testicular hypofunction: Secondary | ICD-10-CM

## 2016-12-02 MED ORDER — TESTOSTERONE CYPIONATE 200 MG/ML IM SOLN
200.0000 mg | Freq: Once | INTRAMUSCULAR | Status: AC
  Administered 2016-12-02: 200 mg via INTRAMUSCULAR

## 2016-12-02 NOTE — Progress Notes (Signed)
Testosterone IM Injection  Due to Hypogonadism patient is present today for a Testosterone Injection.  Medication: Testosterone Cypionate Dose: 1 ml Location: right upper outer buttocks Lot: [id number]  Exp:03/2018  Patient tolerated well, no complications were noted  Preformed by: C. Rana SnareLowe, CMA   Follow up: 2 weeks

## 2016-12-16 ENCOUNTER — Ambulatory Visit (INDEPENDENT_AMBULATORY_CARE_PROVIDER_SITE_OTHER): Payer: BLUE CROSS/BLUE SHIELD

## 2016-12-16 DIAGNOSIS — E291 Testicular hypofunction: Secondary | ICD-10-CM | POA: Diagnosis not present

## 2016-12-16 MED ORDER — TESTOSTERONE CYPIONATE 200 MG/ML IM SOLN
200.0000 mg | Freq: Once | INTRAMUSCULAR | Status: AC
  Administered 2016-12-16: 200 mg via INTRAMUSCULAR

## 2016-12-16 NOTE — Progress Notes (Signed)
Testosterone IM Injection  Due to Hypogonadism patient is present today for a Testosterone Injection.  Medication: Testosterone Cypionate Dose: 1mL Location: right upper outer buttocks Lot: [id number] Exp:01/2018  Patient tolerated well, no complications were noted  Preformed by: Chelsea Watkins, LPN   Follow up: 2 weeks 

## 2016-12-30 ENCOUNTER — Ambulatory Visit (INDEPENDENT_AMBULATORY_CARE_PROVIDER_SITE_OTHER): Payer: BLUE CROSS/BLUE SHIELD

## 2016-12-30 DIAGNOSIS — E291 Testicular hypofunction: Secondary | ICD-10-CM

## 2016-12-30 MED ORDER — TESTOSTERONE CYPIONATE 200 MG/ML IM SOLN
200.0000 mg | Freq: Once | INTRAMUSCULAR | Status: AC
  Administered 2016-12-30: 200 mg via INTRAMUSCULAR

## 2016-12-30 NOTE — Progress Notes (Signed)
Testosterone IM Injection  Due to Hypogonadism patient is present today for a Testosterone Injection.  Medication: Testosterone Cypionate Dose: 1mL Location: right upper outer buttocks Lot: [id number] Exp:03/2018  Patient tolerated well, no complications were noted  Preformed by: Rupert Stackshelsea Cristalle Rohm, LPN   Follow up: pt will have labs next week and f/u in 2 weeks.

## 2017-01-04 ENCOUNTER — Ambulatory Visit: Payer: Self-pay | Admitting: Urology

## 2017-01-06 ENCOUNTER — Other Ambulatory Visit: Payer: BLUE CROSS/BLUE SHIELD

## 2017-01-06 DIAGNOSIS — E291 Testicular hypofunction: Secondary | ICD-10-CM

## 2017-01-07 LAB — HEPATIC FUNCTION PANEL
ALBUMIN: 4.2 g/dL (ref 3.5–5.5)
ALT: 20 IU/L (ref 0–44)
AST: 24 IU/L (ref 0–40)
Alkaline Phosphatase: 69 IU/L (ref 39–117)
BILIRUBIN TOTAL: 0.4 mg/dL (ref 0.0–1.2)
BILIRUBIN, DIRECT: 0.12 mg/dL (ref 0.00–0.40)
TOTAL PROTEIN: 6.9 g/dL (ref 6.0–8.5)

## 2017-01-07 LAB — HEMOGLOBIN: HEMOGLOBIN: 16.1 g/dL (ref 13.0–17.7)

## 2017-01-07 LAB — PSA: Prostate Specific Ag, Serum: 1 ng/mL (ref 0.0–4.0)

## 2017-01-07 LAB — TESTOSTERONE: TESTOSTERONE: 517 ng/dL (ref 264–916)

## 2017-01-10 ENCOUNTER — Telehealth: Payer: Self-pay

## 2017-01-10 NOTE — Telephone Encounter (Signed)
Pt has an appt tomorrow °

## 2017-01-10 NOTE — Telephone Encounter (Signed)
-----   Message from Harle BattiestShannon A McGowan, PA-C sent at 01/09/2017  9:57 AM EST ----- Please let Nicholas RuizJohn know that his labs are good.  We will see him in March.

## 2017-01-11 ENCOUNTER — Ambulatory Visit (INDEPENDENT_AMBULATORY_CARE_PROVIDER_SITE_OTHER): Payer: BLUE CROSS/BLUE SHIELD | Admitting: Urology

## 2017-01-11 ENCOUNTER — Encounter: Payer: Self-pay | Admitting: Urology

## 2017-01-11 DIAGNOSIS — E291 Testicular hypofunction: Secondary | ICD-10-CM | POA: Diagnosis not present

## 2017-01-11 NOTE — Progress Notes (Signed)
01/11/2017 8:33 AM   Blain PaisJohn Bihm 1970-10-19 [id number]  Referring provider: No referring provider defined for this encounter.  Chief Complaint  Patient presents with  . Hypogonadism    follow up     HPI: 46 year old male presents for follow-up of hypogonadism.  He is currently receiving 200 mg testosterone cypionate every 2 weeks.  He has good energy level and libido.  His wife is a CMA and he is switching to home injections. He has no complaints today.  Blood work performed last week stable with a testosterone level of 517, hemoglobin 16.1 and PSA 1.0.  PMH: Past Medical History:  Diagnosis Date  . Hypogonadism in male   . RA (rheumatoid arthritis) (HCC)     Surgical History: No past surgical history on file.  Home Medications:  Allergies as of 01/11/2017      Reactions   No Known Allergies       Medication List        Accurate as of 01/11/17  8:33 AM. Always use your most recent med list.          meloxicam 15 MG tablet Commonly known as:  MOBIC Take 15 mg by mouth daily.   testosterone cypionate 200 MG/ML injection Commonly known as:  DEPOTESTOSTERONE CYPIONATE INJECT 1 ML INTO THE MUSCLE EVERY 14 DAYS       Allergies:  Allergies  Allergen Reactions  . No Known Allergies     Family History: Family History  Problem Relation Age of Onset  . Benign prostatic hyperplasia Father   . Prostate cancer Neg Hx   . Kidney cancer Neg Hx   . Bladder Cancer Neg Hx     Social History:  reports that  has never smoked. he has never used smokeless tobacco. He reports that he drinks alcohol. He reports that he does not use drugs.  ROS: UROLOGY Frequent Urination?: No Hard to postpone urination?: No Burning/pain with urination?: No Get up at night to urinate?: No Leakage of urine?: No Urine stream starts and stops?: No Trouble starting stream?: No Do you have to strain to urinate?: No Blood in urine?: No Urinary tract infection?: No Sexually  transmitted disease?: No Injury to kidneys or bladder?: No Painful intercourse?: No Weak stream?: No Erection problems?: No Penile pain?: No  Gastrointestinal Nausea?: No Vomiting?: No Indigestion/heartburn?: No Diarrhea?: No Constipation?: No  Constitutional Fever: No Night sweats?: No Weight loss?: No Fatigue?: No  Skin Skin rash/lesions?: No Itching?: No  Eyes Blurred vision?: No Double vision?: No  Ears/Nose/Throat Sore throat?: No Sinus problems?: No  Hematologic/Lymphatic Swollen glands?: No Easy bruising?: No  Cardiovascular Leg swelling?: No Chest pain?: No  Respiratory Cough?: No Shortness of breath?: No  Endocrine Excessive thirst?: No  Musculoskeletal Back pain?: No Joint pain?: No  Neurological Headaches?: No Dizziness?: No  Psychologic Depression?: No Anxiety?: No  Physical Exam: BP 134/87   Pulse 73   Ht 5\' 11"  (1.803 m)   Wt 180 lb (81.6 kg)   BMI 25.10 kg/m   Constitutional:  Alert and oriented, No acute distress. HEENT: [place] AT, moist mucus membranes.  Trachea midline, no masses. Cardiovascular: No clubbing, cyanosis, or edema. Respiratory: Normal respiratory effort, no increased work of breathing. Skin: No rashes, bruises or suspicious lesions. Lymph: No cervical or inguinal adenopathy. Neurologic: Grossly intact, no focal deficits, moving all 4 extremities. Psychiatric: Normal mood and affect.  Laboratory Data: Lab Results  Component Value Date   HGB 16.1 01/06/2017   HCT  45.4 10/14/2016    Lab Results  Component Value Date   PSA1 1.0 01/06/2017   PSA1 1.0 10/14/2016   PSA1 0.7 03/15/2016    Lab Results  Component Value Date   TESTOSTERONE 517 01/06/2017     Assessment & Plan:   1. Hypogonadism in male Doing well on testosterone replacement.  He will begin the injections at home.  Follow-up 6 months.   Return in about 6 months (around 07/12/2017) for Recheck.    Riki AltesScott C Stoioff, MD  [facility] [address], [address] [place], [place] [id number] [phone]

## 2017-01-12 ENCOUNTER — Encounter: Payer: Self-pay | Admitting: Urology

## 2017-01-12 NOTE — Telephone Encounter (Signed)
New prescription was completed by Dr. Lonna CobbStoioff [facility] [place].  Contacted the pharmacy and confirmed receipt.  Rx will be ready for patient this afternoon.  Called patient to confirm.  Patient expressed understanding.

## 2017-03-05 ENCOUNTER — Ambulatory Visit
Admission: EM | Admit: 2017-03-05 | Discharge: 2017-03-05 | Disposition: A | Discharge status: Home / Self Care | Payer: BLUE CROSS/BLUE SHIELD | Attending: Family Medicine | Admitting: Family Medicine

## 2017-03-05 ENCOUNTER — Other Ambulatory Visit: Payer: Self-pay

## 2017-03-05 DIAGNOSIS — R05 Cough: Secondary | ICD-10-CM | POA: Diagnosis not present

## 2017-03-05 DIAGNOSIS — J111 Influenza due to unidentified influenza virus with other respiratory manifestations: Secondary | ICD-10-CM | POA: Diagnosis not present

## 2017-03-05 LAB — RAPID INFLUENZA A&B ANTIGENS (ARMC ONLY): INFLUENZA A (ARMC): NEGATIVE

## 2017-03-05 LAB — RAPID INFLUENZA A&B ANTIGENS: Influenza B (ARMC): NEGATIVE

## 2017-03-05 MED ORDER — OSELTAMIVIR PHOSPHATE 75 MG PO CAPS: 75.0000 mg | ORAL_CAPSULE | Freq: Two times a day (BID) | ORAL | 0 refills | Status: DC

## 2017-03-05 NOTE — ED Provider Notes (Addendum)
[facility]    CSN: [id number] Arrival date & time: 03/05/17  1542  History   Chief Complaint Chief Complaint  Patient presents with  . Generalized Body Aches   HPI 47 year old male presents with fever, cough, generalized body aches.  Started abruptly this morning.  He reports no sick contacts.  He does state that his wife works at [employer] and may have brought something home.  He feels poorly.  Ongoing fever and diffuse body aches.  Mild cough.  No known exacerbating relieving factors.  No other reported symptoms.  No other complaints at this time.  Past Medical History:  Diagnosis Date  . Hypogonadism in male   . RA (rheumatoid arthritis) [facility])    Patient Active Problem List   Diagnosis Date Noted  . Hypogonadism in male 01/11/2017   Past Surgical History:  Procedure Laterality Date  . NO PAST SURGERIES     Home Medications    Prior to Admission medications   Medication Sig Start Date End Date Taking? Authorizing Provider  meloxicam (MOBIC) 15 MG tablet Take 15 mg by mouth daily.   Yes [provider]  testosterone cypionate (DEPOTESTOSTERONE CYPIONATE) 200 MG/ML injection INJECT 1 ML INTO THE MUSCLE EVERY 14 DAYS 08/23/16  Yes McGowan, Carollee Herter A, PA-C  oseltamivir (TAMIFLU) 75 MG capsule Take 1 capsule (75 mg total) by mouth every 12 (twelve) hours. 03/05/17   Tommie Sams, DO    Family History Family History  Problem Relation Age of Onset  . Benign prostatic hyperplasia Father   . Cancer Mother   . Prostate cancer Neg Hx   . Kidney cancer Neg Hx   . Bladder Cancer Neg Hx     Social History Social History   Tobacco Use  . Smoking status: Never Smoker  . Smokeless tobacco: Never Used  Substance Use Topics  . Alcohol use: Yes    Comment: occasionally  . Drug use: No   Allergies   No known allergies   Review of Systems Review of Systems  Constitutional: Positive for fever.  Respiratory: Positive for cough.     Musculoskeletal:       Body aches.   Physical Exam Triage Vital Signs ED Triage Vitals  Enc Vitals Group     BP 03/05/17 1618 133/79     Pulse Rate 03/05/17 1618 (!) 105     Resp 03/05/17 1618 19     Temp 03/05/17 1618 100.1 F (37.8 C)     Temp Source 03/05/17 1618 Oral     SpO2 03/05/17 1618 98 %     Weight 03/05/17 1617 180 lb (81.6 kg)     Height 03/05/17 1617 5\' 11"  (1.803 m)     Head Circumference --      Peak Flow --      Pain Score 03/05/17 1617 4     Pain Loc --      Pain Edu? --      Excl. in GC? --    Updated Vital Signs BP 133/79 (BP Location: Right Arm)   Pulse (!) 105   Temp 100.1 F (37.8 C) (Oral)   Resp 19   Ht 5\' 11"  (1.803 m)   Wt 180 lb (81.6 kg)   SpO2 98%   BMI 25.10 kg/m   Physical Exam  Constitutional: He is oriented to person, place, and time. He appears well-developed and well-nourished. No distress.  HENT:  Head: Normocephalic and atraumatic.  Mouth/Throat: Oropharynx is clear and moist.  Eyes: Conjunctivae are normal. Right eye exhibits no discharge. Left eye exhibits no discharge.  Cardiovascular: Regular rhythm.  Tachycardic.  Pulmonary/Chest: Effort normal and breath sounds normal. He has no wheezes. He has no rales.  Neurological: He is alert and oriented to person, place, and time.  Psychiatric: He has a normal mood and affect. His behavior is normal.  Nursing note and vitals reviewed.  UC Treatments / Results  Labs (all labs ordered are listed, but only abnormal results are displayed) Labs Reviewed  RAPID INFLUENZA A&B ANTIGENS ([facility] ONLY)    EKG  EKG Interpretation None       Radiology No results found.  Procedures Procedures (including critical care time)  Medications Ordered in UC Medications - No data to display   Initial Impression / Assessment and Plan / UC Course  I have reviewed the triage vital signs and the nursing notes.  Pertinent labs & imaging results that were available during my care of the  patient were reviewed by me and considered in my medical decision making (see chart for details).     47 year old male presents with suspected influenza.  Influenza testing negative here.  However, the sensitivity and specificity of this test is limited.  I discussed this with the patient and he preferred to proceed with the testing.  Treating with Tamiflu.  It is up to the patient's discretion whether he wants to fill it or not.  Final Clinical Impressions(s) / UC Diagnoses   Final diagnoses:  Influenza    ED Discharge Orders        Ordered    oseltamivir (TAMIFLU) 75 MG capsule  Every 12 hours     03/05/17 1711     Controlled Substance Prescriptions [place] Controlled Substance Registry consulted? Not Applicable   Tommie SamsCook, Lurdes Haltiwanger G, DO 03/05/17 1711    Everlene Otherook, Ritesh Opara G, DO 03/05/17 1714

## 2017-03-05 NOTE — ED Triage Notes (Signed)
Patient complains of body aches, fever, cough that started suddenly this morning.

## 2017-03-05 NOTE — Discharge Instructions (Addendum)
Likely influenza.  Consider taking the medication.  Take care  Dr. Adriana Simasook

## 2017-03-08 ENCOUNTER — Telehealth: Payer: Self-pay

## 2017-03-08 NOTE — Telephone Encounter (Signed)
Called pt for f/u and left a VMM with our return contact information for questions/concerns. 

## 2017-04-17 ENCOUNTER — Other Ambulatory Visit: Payer: BLUE CROSS/BLUE SHIELD

## 2017-04-19 ENCOUNTER — Ambulatory Visit: Payer: BLUE CROSS/BLUE SHIELD | Admitting: Urology

## 2017-05-31 ENCOUNTER — Telehealth: Payer: Self-pay | Admitting: Urology

## 2017-05-31 ENCOUNTER — Other Ambulatory Visit: Payer: Self-pay | Admitting: Urology

## 2017-05-31 DIAGNOSIS — E291 Testicular hypofunction: Secondary | ICD-10-CM

## 2017-05-31 MED ORDER — TESTOSTERONE CYPIONATE 200 MG/ML IM SOLN: 200.0000 mg | INTRAMUSCULAR | 0 refills | Status: DC

## 2017-05-31 NOTE — Telephone Encounter (Signed)
Script sent  

## 2017-05-31 NOTE — Telephone Encounter (Signed)
Pt called office asking for a refill on his Testosterone, [facility] [place]. Please advise pt at [phone]

## 2017-05-31 NOTE — Telephone Encounter (Signed)
Patient is requesting a refill on his testosterone cypionate.  It is okay to refill.  He has an appointment with Dr. Lonna Cobb in June.

## 2017-06-08 MED ORDER — TESTOSTERONE CYPIONATE 200 MG/ML IM SOLN: 200.0000 mg | INTRAMUSCULAR | 0 refills | Status: DC

## 2017-07-12 ENCOUNTER — Ambulatory Visit (INDEPENDENT_AMBULATORY_CARE_PROVIDER_SITE_OTHER): Payer: BLUE CROSS/BLUE SHIELD | Admitting: Urology

## 2017-07-12 ENCOUNTER — Encounter: Payer: Self-pay | Admitting: Urology

## 2017-07-12 VITALS — BP 124/80 | HR 73 | Ht 71.0 in | Wt 187.4 lb

## 2017-07-12 DIAGNOSIS — E291 Testicular hypofunction: Secondary | ICD-10-CM | POA: Diagnosis not present

## 2017-07-12 NOTE — Progress Notes (Signed)
07/12/2017 8:54 AM   Nicholas PaisJohn Sweeney Jan 29, 1971 [id number]  Referring provider: No referring provider defined for this encounter.  Chief Complaint  Patient presents with  . Hypogonadism    HPI: 47 year old male presents for follow-up of hypogonadism.  He is currently injecting 200 mg every 2 weeks.  His last injection was approximately 4 days ago.  He notes good energy level and libido.  He has no bothersome lower urinary tract symptoms.  He denies breast tenderness/enlargement or lower extremity edema.   PMH: Past Medical History:  Diagnosis Date  . Hypogonadism in male   . RA (rheumatoid arthritis) (HCC)     Surgical History: Past Surgical History:  Procedure Laterality Date  . NO PAST SURGERIES      Home Medications:  Allergies as of 07/12/2017      Reactions   No Known Allergies       Medication List        Accurate as of 07/12/17  8:54 AM. Always use your most recent med list.          fluticasone 50 MCG/ACT nasal spray Commonly known as:  FLONASE INSTILL 2 SPRAYS INTO EACH NOSTRIL EVERY MORNING   meloxicam 15 MG tablet Commonly known as:  MOBIC Take 15 mg by mouth daily.   testosterone cypionate 200 MG/ML injection Commonly known as:  DEPOTESTOSTERONE CYPIONATE Inject 1 mL (200 mg total) into the muscle every 14 (fourteen) days.       Allergies:  Allergies  Allergen Reactions  . No Known Allergies     Family History: Family History  Problem Relation Age of Onset  . Benign prostatic hyperplasia Father   . Cancer Mother   . Prostate cancer Neg Hx   . Kidney cancer Neg Hx   . Bladder Cancer Neg Hx     Social History:  reports that he has never smoked. He has never used smokeless tobacco. He reports that he drinks alcohol. He reports that he does not use drugs.  ROS: UROLOGY Frequent Urination?: No Hard to postpone urination?: No Burning/pain with urination?: No Get up at night to urinate?: No Leakage of urine?: No Urine stream  starts and stops?: No Trouble starting stream?: No Do you have to strain to urinate?: No Blood in urine?: No Urinary tract infection?: No Sexually transmitted disease?: No Injury to kidneys or bladder?: No Painful intercourse?: No Weak stream?: No Erection problems?: No Penile pain?: No  Gastrointestinal Nausea?: No Vomiting?: No Indigestion/heartburn?: No Diarrhea?: No Constipation?: No  Constitutional Fever: No Night sweats?: No Weight loss?: No Fatigue?: No  Skin Skin rash/lesions?: No Itching?: No  Eyes Blurred vision?: No Double vision?: No  Ears/Nose/Throat Sore throat?: No Sinus problems?: No  Hematologic/Lymphatic Swollen glands?: No Easy bruising?: No  Cardiovascular Leg swelling?: No Chest pain?: No  Respiratory Cough?: No Shortness of breath?: No  Endocrine Excessive thirst?: No  Musculoskeletal Back pain?: No Joint pain?: No  Neurological Headaches?: No Dizziness?: No  Psychologic Depression?: No Anxiety?: No  Physical Exam: BP 124/80 (BP Location: Left Arm, Patient Position: Sitting, Cuff Size: Normal)   Pulse 73   Ht 5\' 11"  (1.803 m)   Wt 187 lb 6.4 oz (85 kg)   BMI 26.14 kg/m   Constitutional:  Alert and oriented, No acute distress. HEENT: [place] AT, moist mucus membranes.  Trachea midline, no masses. Cardiovascular: No clubbing, cyanosis, or edema. Respiratory: Normal respiratory effort, no increased work of breathing. GI: Abdomen is soft, nontender, nondistended, no abdominal masses GU:  No CVA tenderness.  Prostate 30 g, smooth without nodules Lymph: No cervical or inguinal lymphadenopathy. Skin: No rashes, bruises or suspicious lesions. Neurologic: Grossly intact, no focal deficits, moving all 4 extremities. Psychiatric: Normal mood and affect.   Assessment & Plan:   47 year old male with hypogonadism on TRT.  PSA, hematocrit and testosterone level were ordered.  If stable he will follow-up in 6 months for  hematocrit/testosterone and 1 year for office visit/monitoring labs.   Return in about 1 year (around 07/13/2018) for Recheck.  Riki Altes, MD  [facility] [address], [address] [place], [place] [address] [phone]

## 2017-07-13 ENCOUNTER — Telehealth: Payer: Self-pay

## 2017-07-13 LAB — TESTOSTERONE: TESTOSTERONE: 405 ng/dL (ref 264–916)

## 2017-07-13 LAB — PSA: Prostate Specific Ag, Serum: 0.9 ng/mL (ref 0.0–4.0)

## 2017-07-13 LAB — HEMATOCRIT: HEMATOCRIT: 49 % (ref 37.5–51.0)

## 2017-07-13 NOTE — Telephone Encounter (Signed)
Pt states he already got his results on mychart

## 2017-07-13 NOTE — Telephone Encounter (Signed)
-----   Message from Riki AltesScott C Stoioff, MD sent at 07/13/2017  1:09 PM EDT ----- ASA looks good at 0.9 and hematocrit was normal.  Testosterone level was 405.

## 2017-12-13 ENCOUNTER — Encounter: Payer: Self-pay | Admitting: Urology

## 2017-12-14 ENCOUNTER — Other Ambulatory Visit: Payer: Self-pay | Admitting: Urology

## 2017-12-14 DIAGNOSIS — E291 Testicular hypofunction: Secondary | ICD-10-CM

## 2017-12-14 MED ORDER — TESTOSTERONE CYPIONATE 200 MG/ML IM SOLN
200.0000 mg | INTRAMUSCULAR | 0 refills | Status: DC
Start: 2017-12-14 — End: 2018-03-09

## 2017-12-14 NOTE — Telephone Encounter (Signed)
Please advise on testosterone for pt

## 2017-12-14 NOTE — Telephone Encounter (Signed)
Patient called and is asking for more medication? He said that he has run out of his testosterone medication, he gives it to himself every two weeks, but now wants to start every week?  He has a lab app only in December   [name]

## 2017-12-14 NOTE — Telephone Encounter (Signed)
Rx sent 

## 2018-01-10 ENCOUNTER — Other Ambulatory Visit: Payer: Self-pay

## 2018-01-10 DIAGNOSIS — E291 Testicular hypofunction: Secondary | ICD-10-CM

## 2018-01-11 ENCOUNTER — Other Ambulatory Visit: Payer: BLUE CROSS/BLUE SHIELD

## 2018-01-11 DIAGNOSIS — E291 Testicular hypofunction: Secondary | ICD-10-CM

## 2018-01-12 LAB — TESTOSTERONE: Testosterone: 434 ng/dL (ref 264–916)

## 2018-03-09 ENCOUNTER — Other Ambulatory Visit: Payer: Self-pay

## 2018-03-09 DIAGNOSIS — E291 Testicular hypofunction: Secondary | ICD-10-CM

## 2018-03-09 NOTE — Telephone Encounter (Signed)
Refill Request.  

## 2018-03-12 MED ORDER — TESTOSTERONE CYPIONATE 200 MG/ML IM SOLN: 200.0000 mg | INTRAMUSCULAR | 0 refills | Status: DC

## 2018-04-06 ENCOUNTER — Other Ambulatory Visit: Payer: Self-pay | Admitting: Urology

## 2018-04-06 NOTE — Telephone Encounter (Signed)
Spoke to patient and informed him that the pharmacy has the remaining refills from the last RX.

## 2018-04-06 NOTE — Telephone Encounter (Signed)
Pt needs a refill on his Testosterone

## 2018-06-04 ENCOUNTER — Telehealth: Payer: Self-pay | Admitting: Urology

## 2018-06-04 DIAGNOSIS — E291 Testicular hypofunction: Secondary | ICD-10-CM

## 2018-06-04 MED ORDER — TESTOSTERONE CYPIONATE 200 MG/ML IM SOLN: 200.0000 mg | INTRAMUSCULAR | 0 refills | Status: DC

## 2018-06-04 NOTE — Telephone Encounter (Signed)
Spoke with patient , syringe plunger push medication out of syringe back instead of into patient during injection. This is the second time this has happened he has spoke with pharmacy about getting different brand of syring and may change pharmacy if they are unable to change. They will not give him another vial early due to being controled substance. Ok to fill?

## 2018-06-04 NOTE — Telephone Encounter (Signed)
Pt called and states that he did his Testosterone  injection and it malfunctioned and he would like a call back to see if another shot can be called in, also different syringes. Please advise.

## 2018-06-04 NOTE — Telephone Encounter (Signed)
Yes, okay to fill. rx sent

## 2018-06-13 ENCOUNTER — Telehealth: Payer: Self-pay | Admitting: Urology

## 2018-06-13 DIAGNOSIS — E291 Testicular hypofunction: Secondary | ICD-10-CM

## 2018-06-13 NOTE — Telephone Encounter (Signed)
Pt LMOM he is still having issues with his pharmacy.  That was all he said, if someone could please give him a call.

## 2018-06-13 NOTE — Telephone Encounter (Signed)
Spoke with patient, he has been adminstering Testosterone injections and he states the last injection he was unable to get the full dosage due to a syringe malfunction. He talked to the pharmacy and would not compensate him for a different syringe or medication. He will be out of medication for the week. Please advise.

## 2018-06-14 ENCOUNTER — Other Ambulatory Visit: Payer: Self-pay | Admitting: Specialist

## 2018-06-14 DIAGNOSIS — M5412 Radiculopathy, cervical region: Secondary | ICD-10-CM

## 2018-06-14 MED ORDER — TESTOSTERONE CYPIONATE 200 MG/ML IM SOLN: 200.0000 mg | INTRAMUSCULAR | 0 refills | Status: DC

## 2018-06-14 NOTE — Telephone Encounter (Signed)
Yes

## 2018-06-14 NOTE — Telephone Encounter (Signed)
rx sent

## 2018-06-14 NOTE — Telephone Encounter (Signed)
I sent in a 10 mL refill on 06/04/2018.  Please check with pharmacy on why he cannot get any more medication.

## 2018-06-14 NOTE — Addendum Note (Signed)
Addended by: Irineo Axon C on: 06/14/2018 03:00 PM   Modules accepted: Orders

## 2018-06-14 NOTE — Telephone Encounter (Signed)
So does he just need a 1 mL refill to make up for the dose he missed?

## 2018-06-14 NOTE — Telephone Encounter (Signed)
Spoke with pharmacy-The patient did receive his refill. He stated he lost on the dosage he was supposed to administer this week, so another refill would have to be sent in early. Patient is aware he will have to pay for another refill and the pharmacy will not compensate for the other bottle. Please advise.

## 2018-06-26 ENCOUNTER — Other Ambulatory Visit: Payer: Self-pay

## 2018-06-26 ENCOUNTER — Ambulatory Visit
Admission: RE | Admit: 2018-06-26 | Discharge: 2018-06-26 | Disposition: A | Discharge status: Home / Self Care | Payer: BLUE CROSS/BLUE SHIELD | Source: Ambulatory Visit | Attending: Specialist | Admitting: Specialist

## 2018-06-26 DIAGNOSIS — M5412 Radiculopathy, cervical region: Secondary | ICD-10-CM

## 2018-07-13 ENCOUNTER — Ambulatory Visit: Payer: BLUE CROSS/BLUE SHIELD | Admitting: Urology

## 2018-08-13 ENCOUNTER — Other Ambulatory Visit: Payer: Self-pay

## 2018-08-14 ENCOUNTER — Encounter: Payer: Self-pay | Admitting: Urology

## 2018-08-14 ENCOUNTER — Ambulatory Visit (INDEPENDENT_AMBULATORY_CARE_PROVIDER_SITE_OTHER): Payer: BC Managed Care – PPO | Admitting: Urology

## 2018-08-14 ENCOUNTER — Other Ambulatory Visit: Payer: Self-pay

## 2018-08-14 VITALS — BP 117/77 | HR 66 | Ht 71.0 in | Wt 180.2 lb

## 2018-08-14 DIAGNOSIS — E291 Testicular hypofunction: Secondary | ICD-10-CM | POA: Diagnosis not present

## 2018-08-14 DIAGNOSIS — M1610 Unilateral primary osteoarthritis, unspecified hip: Secondary | ICD-10-CM | POA: Insufficient documentation

## 2018-08-14 DIAGNOSIS — M25473 Effusion, unspecified ankle: Secondary | ICD-10-CM | POA: Insufficient documentation

## 2018-08-14 NOTE — Progress Notes (Signed)
08/14/2018 8:44 AM   Nicholas Sweeney 13-May-1970 [id number]  Referring provider: No referring provider defined for this encounter.  Chief Complaint  Patient presents with  . Hypogonadism    HPI: 48 year old male presents for annual follow-up of hypogonadism.  He is injecting 200 mg testosterone cypionate every 2 weeks.  He had blood work drawn by his PCP on 06/20/2018.  Testosterone level was 606.  Hematocrit slightly elevated at 51.  A PSA was not drawn.  He denies bothersome lower urinary tract symptoms or breast tenderness/enlargement.  He does note some increased tiredness and fatigue towards the end of his injection cycle.    PMH: Past Medical History:  Diagnosis Date  . Hypogonadism in male   . RA (rheumatoid arthritis) (HCC)     Surgical History: Past Surgical History:  Procedure Laterality Date  . NO PAST SURGERIES      Home Medications:  Allergies as of 08/14/2018      Reactions   No Known Allergies       Medication List       Accurate as of August 14, 2018  8:44 AM. If you have any questions, ask your nurse or doctor.        fluticasone 50 MCG/ACT nasal spray Commonly known as: FLONASE INSTILL 2 SPRAYS INTO EACH NOSTRIL EVERY MORNING   meloxicam 15 MG tablet Commonly known as: MOBIC Take 15 mg by mouth daily.   montelukast 10 MG tablet Commonly known as: SINGULAIR Take 10 mg by mouth daily.   Symbicort 160-4.5 MCG/ACT inhaler Generic drug: budesonide-formoterol 2 INH INHALATION TWO TIMES A DAY   testosterone cypionate 200 MG/ML injection Commonly known as: DEPOTESTOSTERONE CYPIONATE Inject 1 mL (200 mg total) into the muscle every 14 (fourteen) days.       Allergies:  Allergies  Allergen Reactions  . No Known Allergies     Family History: Family History  Problem Relation Age of Onset  . Benign prostatic hyperplasia Father   . Cancer Mother   . Prostate cancer Neg Hx   . Kidney cancer Neg Hx   . Bladder Cancer Neg Hx      Social History:  reports that he has never smoked. He has never used smokeless tobacco. He reports current alcohol use. He reports that he does not use drugs.  ROS: UROLOGY Frequent Urination?: No Hard to postpone urination?: No Burning/pain with urination?: No Get up at night to urinate?: No Leakage of urine?: No Urine stream starts and stops?: No Trouble starting stream?: No Do you have to strain to urinate?: No Blood in urine?: No Urinary tract infection?: No Sexually transmitted disease?: No Injury to kidneys or bladder?: No Painful intercourse?: No Weak stream?: No Erection problems?: No Penile pain?: No  Gastrointestinal Nausea?: No Vomiting?: No Indigestion/heartburn?: No Diarrhea?: No Constipation?: No  Constitutional Fever: No Night sweats?: No Weight loss?: No Fatigue?: No  Skin Skin rash/lesions?: No Itching?: No  Eyes Blurred vision?: No Double vision?: No  Ears/Nose/Throat Sore throat?: No Sinus problems?: No  Hematologic/Lymphatic Swollen glands?: No Easy bruising?: No  Cardiovascular Leg swelling?: No Chest pain?: No  Respiratory Cough?: No Shortness of breath?: No  Endocrine Excessive thirst?: No  Musculoskeletal Back pain?: No Joint pain?: No  Neurological Headaches?: No Dizziness?: No  Psychologic Depression?: No Anxiety?: No  Physical Exam: BP 117/77 (BP Location: Left Arm, Patient Position: Sitting, Cuff Size: Normal)   Pulse 66   Ht 5\' 11"  (1.803 m)   Wt 180 lb 3.2 oz (  81.7 kg)   BMI 25.13 kg/m   Constitutional:  Alert and oriented, No acute distress. HEENT: [place] AT, moist mucus membranes.  Trachea midline, no masses. Cardiovascular: No clubbing, cyanosis, or edema. Respiratory: Normal respiratory effort, no increased work of breathing. Skin: No rashes, bruises or suspicious lesions. Neurologic: Grossly intact, no focal deficits, moving all 4 extremities. Psychiatric: Normal mood and affect.   Assessment &  Plan:   48 year old male with hypogonadism doing well on TRT.  He will return next Friday for a PSA and trough testosterone level.  We will need to monitor his hematocrit.  I discussed going to weekly injections and also discussed Xyosted.  He was given literature and if desires to switch will call back.  Nicholas Sweeney, [place] [address] [place], [place] [address] [phone]

## 2018-08-14 NOTE — Patient Instructions (Signed)
Testosterone injection What is this medicine? TESTOSTERONE (tes TOS ter one) is the main male hormone. It supports normal male development such as muscle growth, facial hair, and deep voice. It is used in males to treat low testosterone levels. This medicine may be used for other purposes; ask your health care provider or pharmacist if you have questions. COMMON BRAND NAME(S): Andro-L.A., Aveed, Delatestryl, Depo-Testosterone, Virilon What should I tell my health care provider before I take this medicine? They need to know if you have any of these conditions:  cancer  diabetes  heart disease  kidney disease  liver disease  lung disease  prostate disease  an unusual or allergic reaction to testosterone, other medicines, foods, dyes, or preservatives  pregnant or trying to get pregnant  breast-feeding How should I use this medicine? This medicine is for injection into a muscle. It is usually given by a health care professional in a hospital or clinic setting. Contact your pediatrician regarding the use of this medicine in children. While this medicine may be prescribed for children as young as 12 years of age for selected conditions, precautions do apply. Overdosage: If you think you have taken too much of this medicine contact a poison control center or emergency room at once. NOTE: This medicine is only for you. Do not share this medicine with others. What if I miss a dose? Try not to miss a dose. Your doctor or health care professional will tell you when your next injection is due. Notify the office if you are unable to keep an appointment. What may interact with this medicine?  medicines for diabetes  medicines that treat or prevent blood clots like warfarin  oxyphenbutazone  propranolol  steroid medicines like prednisone or cortisone This list may not describe all possible interactions. Give your health care provider a list of all the medicines, herbs, non-prescription  drugs, or dietary supplements you use. Also tell them if you smoke, drink alcohol, or use illegal drugs. Some items may interact with your medicine. What should I watch for while using this medicine? Visit your doctor or health care professional for regular checks on your progress. They will need to check the level of testosterone in your blood. This medicine is only approved for use in men who have low levels of testosterone related to certain medical conditions. Heart attacks and strokes have been reported with the use of this medicine. Notify your doctor or health care professional and seek emergency treatment if you develop breathing problems; changes in vision; confusion; chest pain or chest tightness; sudden arm pain; severe, sudden headache; trouble speaking or understanding; sudden numbness or weakness of the face, arm or leg; loss of balance or coordination. Talk to your doctor about the risks and benefits of this medicine. This medicine may affect blood sugar levels. If you have diabetes, check with your doctor or health care professional before you change your diet or the dose of your diabetic medicine. Testosterone injections are not commonly used in women. Women should inform their doctor if they wish to become pregnant or think they might be pregnant. There is a potential for serious side effects to an unborn child. Talk to your health care professional or pharmacist for more information. Talk with your doctor or health care professional about your birth control options while taking this medicine. This drug is banned from use in athletes by most athletic organizations. What side effects may I notice from receiving this medicine? Side effects that you should report to   your doctor or health care professional as soon as possible:  allergic reactions like skin rash, itching or hives, swelling of the face, lips, or tongue  breast enlargement  breathing problems  changes in emotions or  moods  deep or hoarse voice  irregular menstrual periods  signs and symptoms of liver injury like dark yellow or brown urine; general ill feeling or flu-like symptoms; light-colored stools; loss of appetite; nausea; right upper belly pain; unusually weak or tired; yellowing of the eyes or skin  stomach pain  swelling of the ankles, feet, hands  too frequent or persistent erections  trouble passing urine or change in the amount of urine Side effects that usually do not require medical attention (report to your doctor or health care professional if they continue or are bothersome):  acne  change in sex drive or performance  facial hair growth  hair loss  headache This list may not describe all possible side effects. Call your doctor for medical advice about side effects. You may report side effects to FDA at 1-800-FDA-1088. Where should I keep my medicine? Keep out of the reach of children. This medicine can be abused. Keep your medicine in a safe place to protect it from theft. Do not share this medicine with anyone. Selling or giving away this medicine is dangerous and against the law. Store at room temperature between 20 and 25 degrees C (68 and 77 degrees F). Do not freeze. Protect from light. Follow the directions for the product you are prescribed. Throw away any unused medicine after the expiration date. NOTE: This sheet is a summary. It may not cover all possible information. If you have questions about this medicine, talk to your doctor, pharmacist, or health care provider.  2020 Elsevier/Gold Standard (2015-02-21 07:33:55)  

## 2018-08-24 ENCOUNTER — Other Ambulatory Visit: Payer: BC Managed Care – PPO

## 2018-08-24 ENCOUNTER — Other Ambulatory Visit: Payer: Self-pay

## 2018-08-24 DIAGNOSIS — E291 Testicular hypofunction: Secondary | ICD-10-CM

## 2018-08-25 LAB — PSA: Prostate Specific Ag, Serum: 0.9 ng/mL (ref 0.0–4.0)

## 2018-08-25 LAB — TESTOSTERONE: Testosterone: 320 ng/dL (ref 264–916)

## 2018-08-25 LAB — HEMATOCRIT: Hematocrit: 46.9 % (ref 37.5–51.0)

## 2018-08-27 ENCOUNTER — Other Ambulatory Visit: Payer: Self-pay | Admitting: Urology

## 2018-08-27 ENCOUNTER — Encounter: Payer: Self-pay | Admitting: Urology

## 2018-08-27 DIAGNOSIS — E291 Testicular hypofunction: Secondary | ICD-10-CM

## 2018-08-27 MED ORDER — TESTOSTERONE CYPIONATE 200 MG/ML IM SOLN: 140.0000 mg | INTRAMUSCULAR | 0 refills | Status: DC

## 2018-08-28 NOTE — Telephone Encounter (Signed)
-----   Message from Abbie Sons, MD sent at 08/27/2018  3:21 PM EDT ----- PSA looks good at 0.9.  Hematocrit normal 46.9.  His trough testosterone level was low normal at 320.  He was injecting 100 mg weekly.  Would have him increase to 140 mg weekly (0.7 cc).  Repeat trough testosterone level and 4-6 weeks.

## 2018-08-31 ENCOUNTER — Other Ambulatory Visit: Payer: Self-pay | Admitting: Urology

## 2018-08-31 ENCOUNTER — Encounter: Payer: Self-pay | Admitting: Urology

## 2018-08-31 MED ORDER — XYOSTED 100 MG/0.5ML ~~LOC~~ SOAJ: 100.0000 mg | SUBCUTANEOUS | 2 refills | Status: DC

## 2018-09-06 ENCOUNTER — Encounter: Payer: Self-pay | Admitting: Urology

## 2018-09-19 ENCOUNTER — Encounter: Payer: Self-pay | Admitting: Urology

## 2018-09-20 ENCOUNTER — Telehealth: Payer: Self-pay

## 2018-09-20 NOTE — Telephone Encounter (Signed)
Called pt's pharmacy asked that they resend PA for xyosted, pharmacist states that she will send.

## 2018-09-21 ENCOUNTER — Encounter: Payer: Self-pay | Admitting: Urology

## 2018-09-24 NOTE — Telephone Encounter (Signed)
Sent a Mychart message about getting Testosterone refilled if need while we are trying to get [place] approved.

## 2018-09-24 NOTE — Telephone Encounter (Signed)
Patient called the office today requesting an update on the prior authorization for the [place],

## 2018-09-28 ENCOUNTER — Telehealth: Payer: Self-pay | Admitting: Urology

## 2018-09-28 NOTE — Telephone Encounter (Signed)
Called [employer] company who informed that this patient's prior authorizations need to go through [place] prior [place], it cannot be submitted via covermymeds or optumrx. Xyosted approved for 09/28/2018 - 09/28/2019.

## 2018-09-28 NOTE — Telephone Encounter (Signed)
Pt called and asked for a return call back to discuss progress of PA.

## 2019-02-07 ENCOUNTER — Other Ambulatory Visit: Payer: Self-pay | Admitting: Urology

## 2019-02-07 DIAGNOSIS — E291 Testicular hypofunction: Secondary | ICD-10-CM

## 2019-02-11 NOTE — Addendum Note (Signed)
Addended by: Frankey Shown on: 02/11/2019 11:16 AM   Modules accepted: Orders

## 2019-03-01 ENCOUNTER — Other Ambulatory Visit: Payer: BC Managed Care – PPO

## 2019-03-01 ENCOUNTER — Other Ambulatory Visit: Payer: Self-pay

## 2019-03-01 DIAGNOSIS — E291 Testicular hypofunction: Secondary | ICD-10-CM

## 2019-03-02 LAB — TESTOSTERONE: Testosterone: 446 ng/dL (ref 264–916)

## 2019-03-02 LAB — HEMATOCRIT: Hematocrit: 49 % (ref 37.5–51.0)

## 2019-03-04 ENCOUNTER — Telehealth: Payer: Self-pay | Admitting: *Deleted

## 2019-03-04 NOTE — Telephone Encounter (Signed)
-----   Message from Riki Altes, MD sent at 03/03/2019  1:38 PM EST ----- Testosterone level better at 446.  Did he ever get started on Xyosted?

## 2019-03-04 NOTE — Telephone Encounter (Signed)
Notified patient as instructed, patient pleased. Discussed follow-up appointments, patient agrees. Patient started this [facility]

## 2019-04-18 ENCOUNTER — Other Ambulatory Visit: Payer: Self-pay

## 2019-04-18 ENCOUNTER — Ambulatory Visit: Payer: BC Managed Care – PPO | Admitting: Dermatology

## 2019-04-18 DIAGNOSIS — L723 Sebaceous cyst: Secondary | ICD-10-CM

## 2019-04-18 MED ORDER — DOXYCYCLINE HYCLATE 100 MG PO CAPS: ORAL_CAPSULE | ORAL | 0 refills | Status: DC

## 2019-04-18 NOTE — Progress Notes (Signed)
   Follow-Up Visit   Subjective  Nicholas Sweeney is a 49 y.o. male who presents for the following: Cyst (Right neck. Present for years, but inflamed for 2 weeks.).  Tender to touch.  Very swollen. Bump Under Skin His subcutaneous nodule is located over the DERM LESION LOCATION: neck. This has been present for several year(s). There has been redness and pain. He does not have a history of epidermal inclusion cysts. He does not have a history of lipomas.   The following portions of the chart were reviewed this encounter and updated as appropriate:     Review of Systems: No other skin or systemic complaints.  Objective  Well appearing patient in no apparent distress; mood and affect are within normal limits.  A focused examination was performed including neck. Relevant physical exam findings are noted in the Assessment and Plan.  Objective  Right Neck: Subcutaneous nodule with erythema and edema, tender to touch, 2.5 cm.   Assessment & Plan  Inflamed epidermoid cyst of skin Right Neck  If cyst recurs, discussed excision when not inflamed.  Incision and Drainage - Right Neck Location: Right neck  Informed Consent: Discussed risks (permanent scarring, light or dark discoloration, infection, pain, bleeding, bruising, redness, damage to adjacent structures, and recurrence of the lesion) and benefits of the procedure, as well as the alternatives.  Informed consent was obtained.  Preparation: The area was prepped with alcohol.  Anesthesia: Lidocaine 1% with epinephrine  Procedure Details: An incision with 11 blade was made overlying the lesion. The lesion drained pus, white, chalky cyst material, and blood. Curettage, sharp dissection, and saline irrigation was performed. A large amount of fluid was drained.   Pieces of cyst wall were extracted.    Antibiotic ointment and a sterile pressure dressing were applied. The patient tolerated procedure well.  Total number of lesions  drained: 1  Plan: The patient was instructed on post-op care. Recommend OTC analgesia as needed for pain.   doxycycline (VIBRAMYCIN) 100 MG capsule - Right Neck  Return if symptoms worsen or fail to improve.   ICherlyn Labella, CMA, am acting as scribe for Willeen Niece, MD .

## 2019-04-18 NOTE — Patient Instructions (Signed)
Wound Care Instructions  1. Cleanse would gently with soap and water once a day then pat dry with clean gauze. Apply a thing coat of Petrolatum (petroleum jelly, "Vaseline") over the wound (unless you have an allergy to this). We recommend that you use a new, sterile tube of Vaseline. Do not pick or remove scabs. Do not remove the yellow or white "healing tissue" from the base of the wound.  2. Cover the wound with fresh, clean, nonstick gauze and secure with paper tape. You may use Band-Aids in place of gauze and tape if the would is small enough, but would recommend trimming much of the tape off as there is often too much. Sometimes Band-Aids can irritate the skin.  3. If you experience any problems, such as abnormal amounts of bleeding, swelling, significant bruising, significant pain, or evidence of infection, please call the office immediately.    

## 2019-05-03 ENCOUNTER — Other Ambulatory Visit: Payer: Self-pay | Admitting: Urology

## 2019-05-03 DIAGNOSIS — E291 Testicular hypofunction: Secondary | ICD-10-CM

## 2019-05-06 ENCOUNTER — Other Ambulatory Visit: Payer: Self-pay

## 2019-05-08 ENCOUNTER — Other Ambulatory Visit: Payer: Self-pay | Admitting: Dermatology

## 2019-05-08 ENCOUNTER — Other Ambulatory Visit: Payer: Self-pay

## 2019-05-08 DIAGNOSIS — E291 Testicular hypofunction: Secondary | ICD-10-CM

## 2019-05-08 DIAGNOSIS — L723 Sebaceous cyst: Secondary | ICD-10-CM

## 2019-08-26 ENCOUNTER — Other Ambulatory Visit: Payer: Self-pay | Admitting: Urology

## 2019-08-26 DIAGNOSIS — E291 Testicular hypofunction: Secondary | ICD-10-CM

## 2019-09-18 ENCOUNTER — Other Ambulatory Visit: Payer: Self-pay

## 2019-09-18 DIAGNOSIS — E291 Testicular hypofunction: Secondary | ICD-10-CM

## 2019-09-20 ENCOUNTER — Other Ambulatory Visit: Payer: BC Managed Care – PPO

## 2019-09-20 ENCOUNTER — Other Ambulatory Visit: Payer: Self-pay

## 2019-09-20 DIAGNOSIS — E291 Testicular hypofunction: Secondary | ICD-10-CM

## 2019-09-21 LAB — TESTOSTERONE: Testosterone: 717 ng/dL (ref 264–916)

## 2019-09-21 LAB — PSA: Prostate Specific Ag, Serum: 0.9 ng/mL (ref 0.0–4.0)

## 2019-09-21 LAB — HEMATOCRIT: Hematocrit: 49 % (ref 37.5–51.0)

## 2019-09-23 ENCOUNTER — Telehealth: Payer: Self-pay | Admitting: *Deleted

## 2019-09-23 NOTE — Telephone Encounter (Signed)
-----   Message from Riki Altes, MD sent at 09/22/2019  9:21 PM EDT ----- Labs look good however he has not been seen in the office since July 2020 and we have to see face to face yearly to since testosterone is a controlled substance.  Please schedule appointment

## 2019-09-23 NOTE — Telephone Encounter (Signed)
Notified patient as instructed, patient pleased. Discussed follow-up appointments, patient agrees  

## 2019-09-27 ENCOUNTER — Ambulatory Visit (INDEPENDENT_AMBULATORY_CARE_PROVIDER_SITE_OTHER): Payer: BC Managed Care – PPO | Admitting: Urology

## 2019-09-27 ENCOUNTER — Other Ambulatory Visit: Payer: Self-pay

## 2019-09-27 ENCOUNTER — Encounter: Payer: Self-pay | Admitting: Urology

## 2019-09-27 VITALS — BP 132/88 | HR 78 | Ht 71.0 in | Wt 180.0 lb

## 2019-09-27 DIAGNOSIS — E291 Testicular hypofunction: Secondary | ICD-10-CM | POA: Diagnosis not present

## 2019-09-27 NOTE — Progress Notes (Signed)
   09/27/2019 8:33 AM   Nicholas Sweeney Jun 02, 1970 [id number]  Referring provider: Evelene Croon, MD [address] [place],  [place] [address]  Chief Complaint  Patient presents with  . Hypogonadism    Urologic history: 1. Hypogonadism -TRT Xyosted  HPI: 49 y.o. male presents for annual follow-up.   Switch to [place] last year which has worked quite well  Denies bothersome LUTS  Labs 09/20/2019: Testosterone 717, PSA 0.9, HCT 49   PMH: Past Medical History:  Diagnosis Date  . Hypogonadism in male   . RA (rheumatoid arthritis) (HCC)     Surgical History: Past Surgical History:  Procedure Laterality Date  . NO PAST SURGERIES      Home Medications:  Allergies as of 09/27/2019      Reactions   No Known Allergies       Medication List       Accurate as of September 27, 2019  8:33 AM. If you have any questions, ask your nurse or doctor.        doxycycline 100 MG capsule Commonly known as: VIBRAMYCIN Take 1 capsule by mouth twice daily x 2 wks, then decrease to 1 by mouth once a day until finished.   fluticasone 50 MCG/ACT nasal spray Commonly known as: FLONASE INSTILL 2 SPRAYS INTO EACH NOSTRIL EVERY MORNING   meloxicam 15 MG tablet Commonly known as: MOBIC Take 15 mg by mouth daily.   montelukast 10 MG tablet Commonly known as: SINGULAIR Take 10 mg by mouth daily.   Xyosted 100 MG/0.5ML Soaj Generic drug: Testosterone Enanthate INJECT 100 MG INTO THE SKIN ONCE A WEEK.       Allergies:  Allergies  Allergen Reactions  . No Known Allergies     Family History: Family History  Problem Relation Age of Onset  . Benign prostatic hyperplasia Father   . Cancer Mother   . Prostate cancer Neg Hx   . Kidney cancer Neg Hx   . Bladder Cancer Neg Hx     Social History:  reports that he has never smoked. He has never used smokeless tobacco. He reports current alcohol use. He reports that he does not use drugs.   Physical Exam: BP 132/88    Pulse 78   Ht 5\' 11"  (1.803 m)   Wt 180 lb (81.6 kg)   BMI 25.10 kg/m   Constitutional:  Alert and oriented, No acute distress. HEENT: [place] AT, moist mucus membranes.  Trachea midline, no masses. Cardiovascular: No clubbing, cyanosis, or edema. Respiratory: Normal respiratory effort, no increased work of breathing. Skin: No rashes, bruises or suspicious lesions. Neurologic: Grossly intact, no focal deficits, moving all 4 extremities. Psychiatric: Normal mood and affect.  Laboratory Data: Lab Results  Component Value Date   HGB 16.1 01/06/2017   HCT 49.0 09/20/2019    Lab Results  Component Value Date   TESTOSTERONE 717 09/20/2019    Assessment & Plan:    1.  Hypogonadism  Doing well on TRT  Continue Xyosted  94-month lab visit testosterone, hematocrit  1 year follow-up office visit with testosterone, hematocrit, PSA   8-month, MD  [facility] [address], [address] [place], [place] [phone]

## 2019-10-21 ENCOUNTER — Other Ambulatory Visit: Payer: Self-pay | Admitting: *Deleted

## 2019-10-21 DIAGNOSIS — E291 Testicular hypofunction: Secondary | ICD-10-CM

## 2019-10-22 MED ORDER — XYOSTED 100 MG/0.5ML ~~LOC~~ SOAJ: SUBCUTANEOUS | 3 refills | Status: DC

## 2019-10-24 ENCOUNTER — Telehealth: Payer: Self-pay | Admitting: Family Medicine

## 2019-10-24 NOTE — Telephone Encounter (Signed)
PA for Xyosted aproved from 10/24/2019 through 10/23/2020.

## 2019-10-28 NOTE — Telephone Encounter (Signed)
Patient left a vmail regarding PA for medication

## 2019-10-28 NOTE — Telephone Encounter (Signed)
Spoke to patient and he will contact pharmacy to fill RX.

## 2019-10-30 ENCOUNTER — Telehealth: Payer: Self-pay | Admitting: *Deleted

## 2019-10-30 DIAGNOSIS — E291 Testicular hypofunction: Secondary | ICD-10-CM

## 2019-10-30 NOTE — Telephone Encounter (Signed)
Patient called in today and [facility] [place] is [facility] of [place] . Called medication into [facility] [phone]

## 2020-02-16 ENCOUNTER — Other Ambulatory Visit: Payer: Self-pay | Admitting: Urology

## 2020-03-24 ENCOUNTER — Other Ambulatory Visit: Payer: Self-pay

## 2020-03-24 DIAGNOSIS — E291 Testicular hypofunction: Secondary | ICD-10-CM

## 2020-03-30 ENCOUNTER — Other Ambulatory Visit: Payer: Self-pay

## 2020-03-31 ENCOUNTER — Other Ambulatory Visit: Payer: Self-pay

## 2020-03-31 ENCOUNTER — Other Ambulatory Visit: Payer: BC Managed Care – PPO

## 2020-03-31 DIAGNOSIS — E291 Testicular hypofunction: Secondary | ICD-10-CM

## 2020-04-01 LAB — HEMOGLOBIN AND HEMATOCRIT, BLOOD
Hematocrit: 49 % (ref 37.5–51.0)
Hemoglobin: 17.1 g/dL (ref 13.0–17.7)

## 2020-04-01 LAB — TESTOSTERONE: Testosterone: 347 ng/dL (ref 264–916)

## 2020-04-06 ENCOUNTER — Encounter: Payer: Self-pay | Admitting: *Deleted

## 2020-04-08 ENCOUNTER — Telehealth: Payer: Self-pay | Admitting: *Deleted

## 2020-04-08 NOTE — Telephone Encounter (Signed)
On the max dose of Xyosted. If symptoms are stable can continue with Xyosted. If he is having increased symptoms of low testosterone and wants them treated would need to switch back to IM injections.  Per Dr.Stoioff Notified patient as instructed, patient pleased. Discussed follow-up appointments, patient agrees

## 2020-07-03 ENCOUNTER — Other Ambulatory Visit: Payer: Self-pay | Admitting: Urology

## 2020-07-10 IMAGING — MR MRI CERVICAL SPINE WITHOUT CONTRAST
5 series · 33 of 48 positions shown · non-contrast
Comparison: None available.

CLINICAL DATA: Initial evaluation for left-sided neck pain
extending into the left shoulder/scapula and upper arm with numbness
for 6 weeks.

EXAM:
MRI CERVICAL AND THORACIC SPINE WITHOUT CONTRAST
TECHNIQUE: Multiplanar and multiecho pulse sequences of the cervical spine, to
include the craniocervical junction and cervicothoracic junction,
and the thoracic spine, were obtained without intravenous contrast.

[Series 5: T2 · sagittal · 3.0mm · 0.62mm/px · 6 of 15 slices shown (1 of 2)]
[im 1/15]
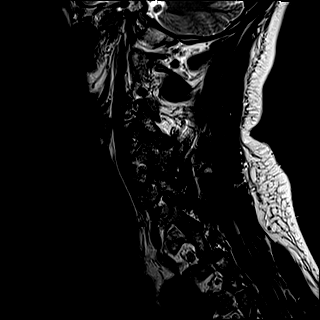
[im 3/15]
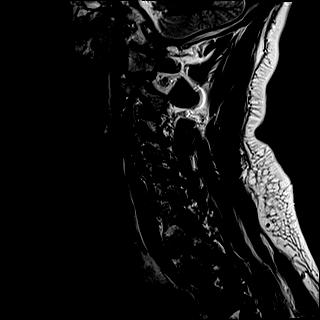
[im 6/15]
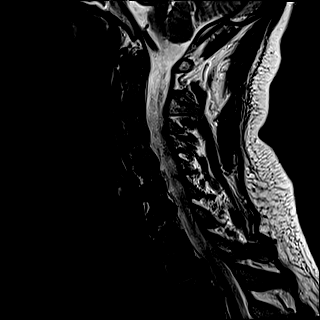
[im 9/15]
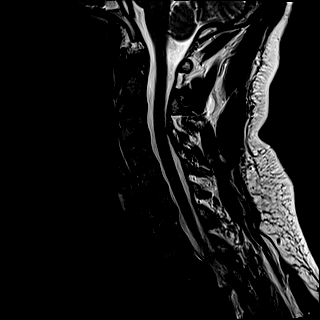
[im 12/15]
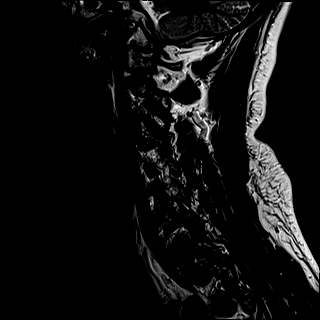
[im 15/15]
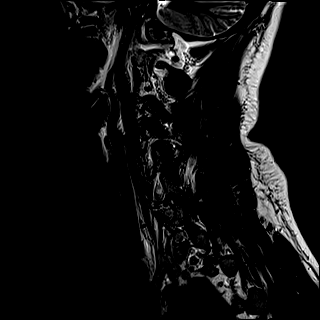

[Series 6: FLAIR · sagittal · 3.0mm · 0.78mm/px · 7 of 15 slices shown]
[im 1/15]
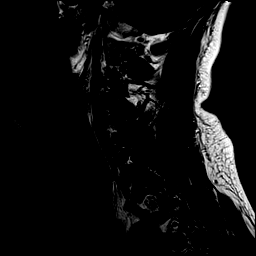
[im 3/15]
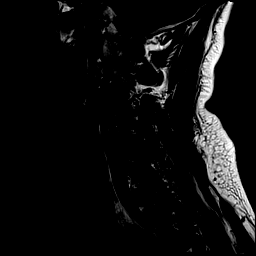
[im 5/15]
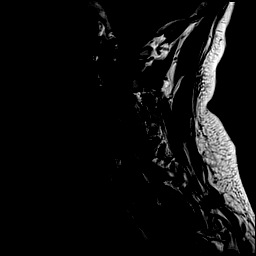
[im 8/15]
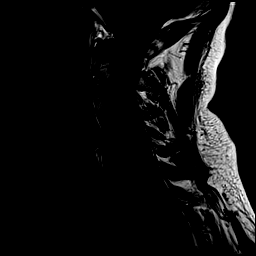
[im 10/15]
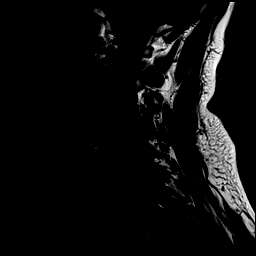
[im 12/15]
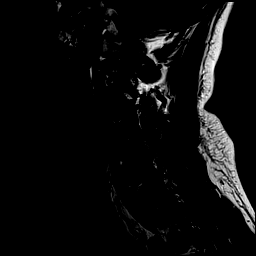
[im 15/15]
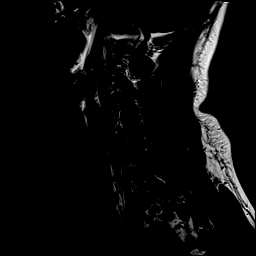

[Series 7: STIR · sagittal · 3.0mm · 0.62mm/px · 7 of 15 slices shown]
[im 1/15]
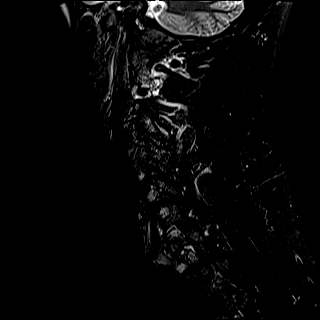
[im 3/15]
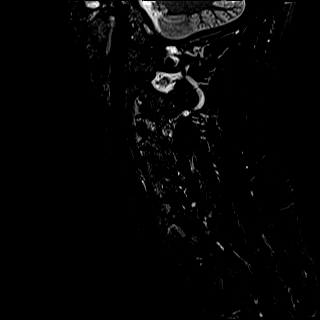
[im 5/15]
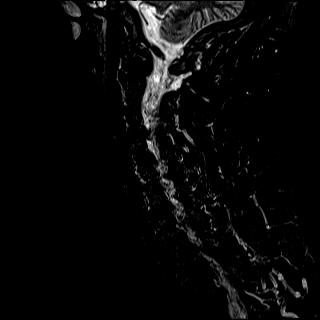
[im 8/15]
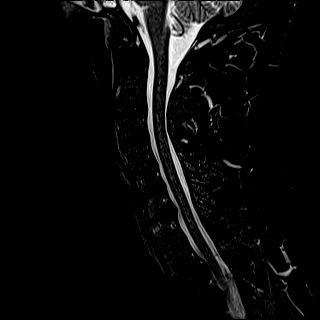
[im 10/15]
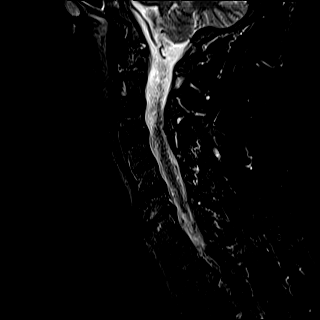
[im 12/15]
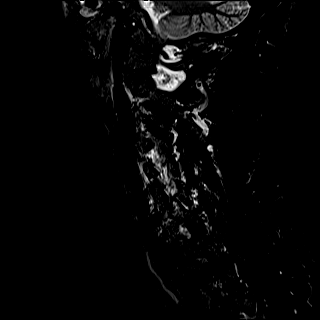
[im 15/15]
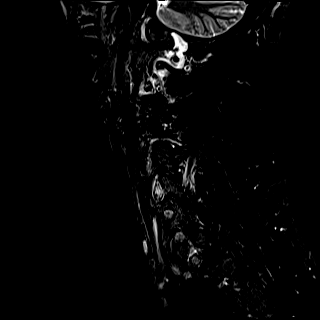

[Series 8: T2 · axial · 3.0mm · 0.70mm/px · z∈[-40,+56]mm · 8 of 29 slices shown (2 of 2)]
[im 1/29]
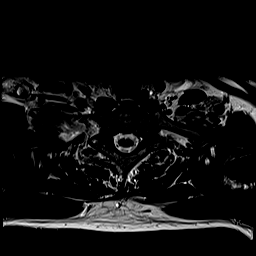
[im 5/29]
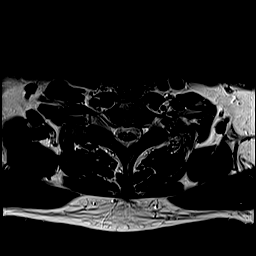
[im 9/29]
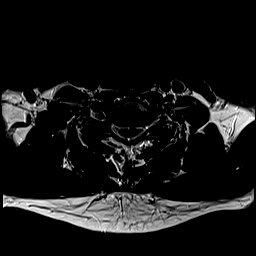
[im 13/29]
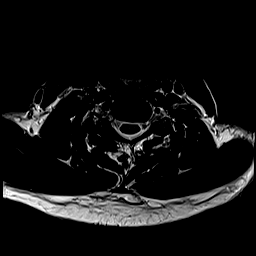
[im 16/29]
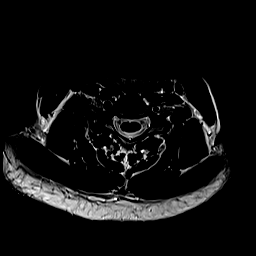
[im 20/29]
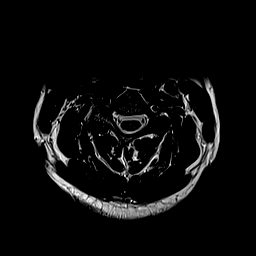
[im 24/29]
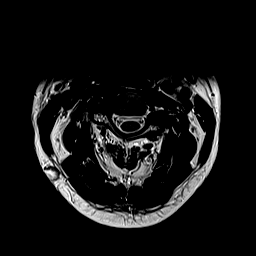
[im 29/29]
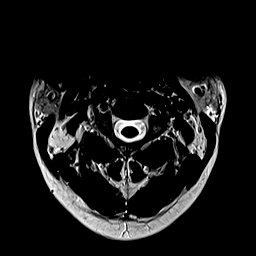

[Series 9: ax mpgr · axial · 3.0mm · 0.35mm/px · z∈[-40,+12]mm · 5 of 29 slices shown]
[im 1/29]
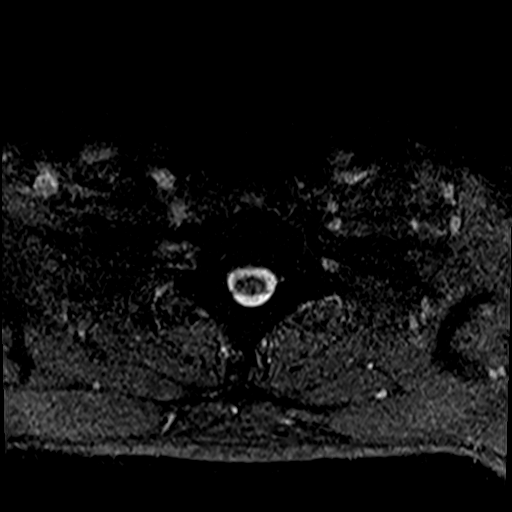
[im 5/29]
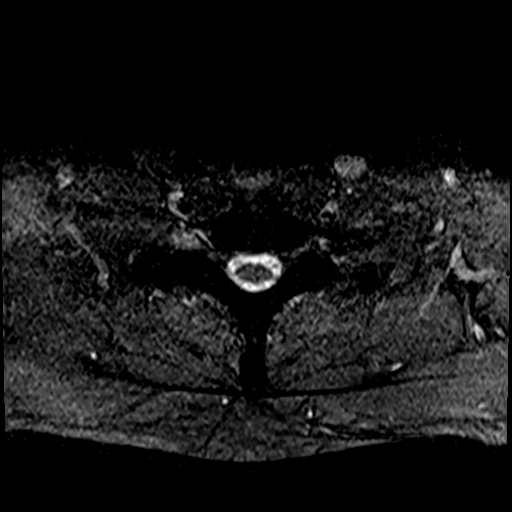
[im 9/29]
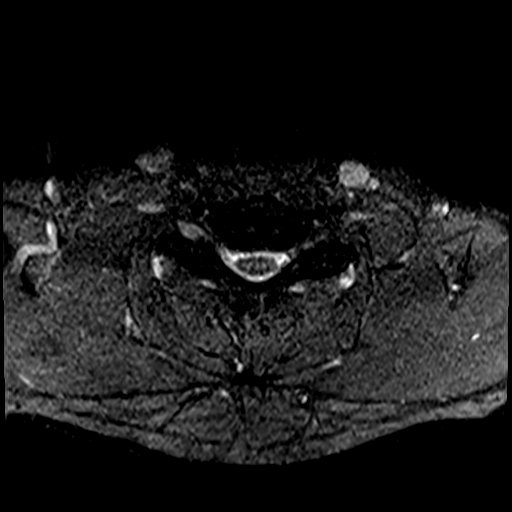
[im 13/29]
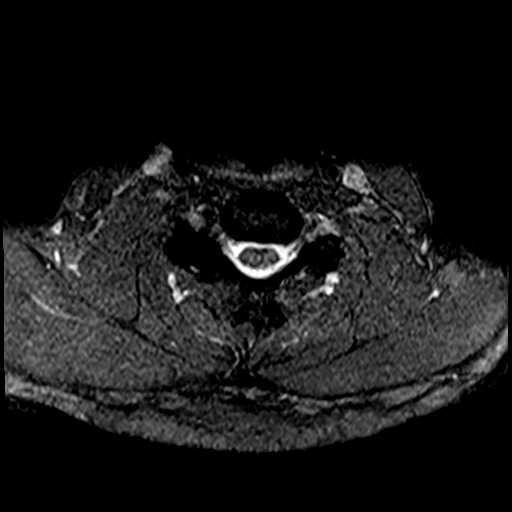
[im 16/29]
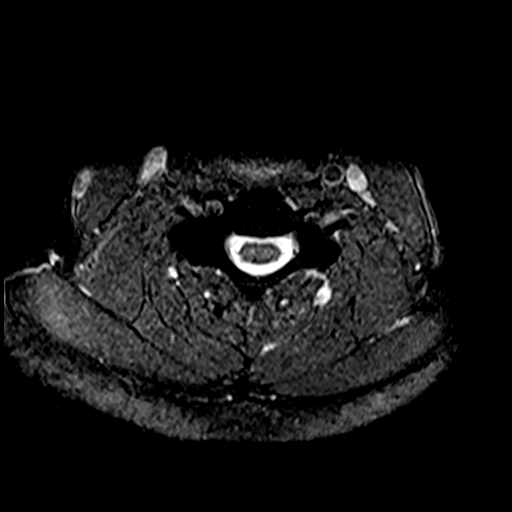

[33 of 48 positions shown; findings below may reference images not displayed]

FINDINGS: MRI CERVICAL SPINE FINDINGS

Alignment: Straightening with slight reversal of the normal cervical
lordosis. Grade 1 anterolisthesis of C4 on C5 and T1 on T2, chronic
and facet mediated.

Vertebrae: Vertebral body heights maintained without evidence for
acute or chronic fracture. Bone marrow signal intensity within
normal limits. No discrete or worrisome osseous lesions. Prominent
reactive endplate changes present about the C6-7 interspace.
Reactive marrow edema present about the right C3-4 facet due to
facet arthritis (series 7, image 2).

Cord: Signal intensity within the thoracic spinal cord is normal.

Posterior Fossa, vertebral arteries, paraspinal tissues: Visualized
brain and posterior fossa within normal limits. Craniocervical
junction normal. Paraspinous and prevertebral soft tissues are
normal. Normal intravascular flow voids seen within the vertebral
arteries bilaterally. Dominant right vertebral noted.

Disc levels:

C2-C3: Moderate left with mild right facet hypertrophy. Resultant
mild left greater than right C3 foraminal narrowing. Central canal
widely patent.

C3-C4: Severe right with moderate left facet arthrosis. Mild annular
disc bulge with bilateral uncovertebral hypertrophy, worse on the
right. Flattening of the ventral thecal sac without spinal stenosis.
Severe right with moderate left C4 foraminal narrowing.

C4-C5: Mild right-sided uncinate spurring. Severe right-sided facet
arthrosis. Right C4-5 facets appear ankylosed. Flattening of the
ventral thecal sac without significant stenosis. Moderate right C5
foraminal narrowing.

C5-C6: Mild annular disc bulge. Moderate right-sided facet
degeneration. No significant canal or neural foraminal stenosis.

C6-C7: Circumferential disc osteophyte with intervertebral disc
space narrowing. Broad posterior component flattens the ventral
thecal sac and resultant mild spinal stenosis. No cord deformity.
Moderate left with mild right C7 foraminal stenosis.

C7-T1:  Negative.

MRI THORACIC SPINE FINDINGS

Alignment: Mild straightening of the normal thoracic kyphosis. In
grade 1 anterolisthesis of T1 on T2, chronic and facet mediated.

Vertebrae: Vertebral body heights maintained without evidence for
acute or chronic fracture. Scattered chronic endplate Schmorl's
nodes noted within the lower thoracic spine. Underlying bone marrow
signal intensity within normal limits. No discrete or worrisome
osseous lesions. Mild reactive endplate changes noted about the T5-6
interspace. No other abnormal marrow edema.

Cord: Signal intensity within the thoracic spinal cord is normal.
Conus medullaris terminates somewhat high at the level of T11-12.

Paraspinal and other soft tissues: Paraspinous soft tissues within
normal limits. Visualized visceral structures are normal. Partially
visualized lungs are clear.

Disc levels:

T1-2: Trace anterolisthesis. Associated mild diffuse disc bulge with
moderate bilateral facet arthrosis. Flattening of the thecal sac
without significant spinal stenosis or cord impingement. Moderate
right with mild left foraminal narrowing.

T2-3: Small biforaminal disc protrusions, left larger than right
(series 17, image 13 on the left, image 8 on the right). Mild
bilateral facet hypertrophy, also slightly worse on the left. No
significant spinal stenosis. Moderate left with mild right foraminal
narrowing. Finding could result in left-sided symptoms.

T3-4: Shallow left paracentral disc protrusion (series 17, image
11). Mild right-sided facet hypertrophy. No canal or foraminal
stenosis.

T4-5: Mild facet hypertrophy. Otherwise unremarkable. No stenosis.

T5-6: Intervertebral disc space narrowing with disc desiccation and
mild reactive endplate changes. Mild diffuse disc bulge. Mild
bilateral facet hypertrophy. No significant canal or foraminal
stenosis.

T6-7: Right paracentral disc protrusion indents the right ventral
thecal sac (series 20, image 20). No significant cord deformity or
impingement. Thecal sac remains patent. Mild bilateral facet
hypertrophy. Foramina are patent.

T7-8: Minimal disc bulge. Mild bilateral facet hypertrophy. No
stenosis.

T8-9: Mild facet hypertrophy. Otherwise unremarkable without
stenosis.

T9-10: Mild facet hypertrophy. No significant spinal stenosis. Mild
left foraminal narrowing.

T10-11: Mild to moderate left greater than right facet hypertrophy.
No significant canal or foraminal stenosis.

T11-12:  Mild facet hypertrophy.  No stenosis.

T12-L1: Mild diffuse disc bulge with endplate changes. No
significant stenosis.
IMPRESSION: MRI CERVICAL SPINE IMPRESSION:

1. Degenerative disc osteophyte at C6-7 with resultant mild spinal
stenosis without cord impingement. No other significant canal
narrowing within the cervical spine.
2. Multifactorial degenerative changes with resultant multilevel
foraminal narrowing as above. Notable findings include severe right
with moderate left C4 foraminal stenosis, moderate right C5
foraminal narrowing, with moderate left C7 foraminal stenosis.
3. Moderate to advanced multilevel facet arthrosis throughout the
upper cervical spine, most notable at C3-4 on the right. Finding
could contribute to underlying neck pain.

MRI THORACIC SPINE IMPRESSION:

1. Small biforaminal disc protrusions at T2-3, left greater than
right, with resultant moderate left and mild right foraminal
narrowing. Finding could result in left-sided symptoms.
2. Additional multilevel degenerative spondylolysis with several
small disc protrusions throughout the upper and mid thoracic spine
as above. No significant spinal stenosis. Additional mild bilateral
foraminal narrowing as described above.

## 2020-08-18 ENCOUNTER — Other Ambulatory Visit: Payer: Self-pay | Admitting: *Deleted

## 2020-08-18 DIAGNOSIS — E291 Testicular hypofunction: Secondary | ICD-10-CM

## 2020-09-25 ENCOUNTER — Other Ambulatory Visit: Payer: BC Managed Care – PPO

## 2020-09-25 ENCOUNTER — Other Ambulatory Visit: Payer: Self-pay

## 2020-09-25 DIAGNOSIS — E291 Testicular hypofunction: Secondary | ICD-10-CM

## 2020-09-26 LAB — TESTOSTERONE: Testosterone: 490 ng/dL (ref 264–916)

## 2020-09-26 LAB — HEMATOCRIT: Hematocrit: 48.7 % (ref 37.5–51.0)

## 2020-09-26 LAB — PSA: Prostate Specific Ag, Serum: 1 ng/mL (ref 0.0–4.0)

## 2020-09-28 ENCOUNTER — Ambulatory Visit: Payer: Self-pay | Admitting: Urology

## 2020-09-28 ENCOUNTER — Telehealth: Payer: Self-pay | Admitting: Family Medicine

## 2020-09-28 NOTE — Telephone Encounter (Signed)
Authorization for Barbaraann Cao has been approved.  Approval dates 09/16/2020-09/16/2021

## 2020-09-29 NOTE — Progress Notes (Signed)
09/30/2020 8:41 AM   Nicholas Sweeney Jun 07, 1970 [id number]  Referring provider: Evelene Croon, MD [address] [place],  [place] [address]  Chief Complaint  Patient presents with   Hypogonadism    Urologic history: 1. Hypogonadism -TRT Xyosted    HPI: 50 y.o. male presents for annual follow-up of hypogonadism.  Remains on Xyosted 100 mg weekly Labs 09/25/2020: Testosterone 490 ng/dL; HCT 19.1 and PSA 1.0 No bothersome LUTS, breast tenderness/enlargement He does have increase symptoms midweek with decreased libido and tiredness and was inquiring if he could add a 50 mg injection midweek   PMH: Past Medical History:  Diagnosis Date   Hypogonadism in male    RA (rheumatoid arthritis) (HCC)     Surgical History: Past Surgical History:  Procedure Laterality Date   NO PAST SURGERIES      Home Medications:  Allergies as of 09/30/2020       Reactions   No Known Allergies         Medication List        Accurate as of September 30, 2020  8:41 AM. If you have any questions, ask your nurse or doctor.          doxycycline 100 MG capsule Commonly known as: VIBRAMYCIN Take 1 capsule by mouth twice daily x 2 wks, then decrease to 1 by mouth once a day until finished.   fluticasone 50 MCG/ACT nasal spray Commonly known as: FLONASE INSTILL 2 SPRAYS INTO EACH NOSTRIL EVERY MORNING   meloxicam 15 MG tablet Commonly known as: MOBIC Take 15 mg by mouth daily.   montelukast 10 MG tablet Commonly known as: SINGULAIR Take 10 mg by mouth daily.   Xyosted 100 MG/0.5ML Soaj Generic drug: Testosterone Enanthate INJECT 100 MG INTO THE SKIN ONCE A WEEK        Allergies:  Allergies  Allergen Reactions   No Known Allergies     Family History: Family History  Problem Relation Age of Onset   Benign prostatic hyperplasia Father    Cancer Mother    Prostate cancer Neg Hx    Kidney cancer Neg Hx    Bladder Cancer Neg Hx     Social History:  reports that  he has never smoked. He has never used smokeless tobacco. He reports current alcohol use. He reports that he does not use drugs.   Physical Exam: BP (!) 149/85   Pulse 80   Ht 5\' 11"  (1.803 m)   Wt 178 lb (80.7 kg)   BMI 24.83 kg/m   Constitutional:  Alert and oriented, No acute distress. HEENT: [place] AT, moist mucus membranes.  Trachea midline, no masses. Cardiovascular: No clubbing, cyanosis, or edema. Respiratory: Normal respiratory effort, no increased work of breathing. Neurologic: Grossly intact, no focal deficits, moving all 4 extremities. Psychiatric: Normal mood and affect.    Assessment & Plan:    1.  Hypogonadism Having increased symptoms midweek.  We discussed that the max approved dose of Xyosted is 100 mg weekly and an additional injection would not be covered He does not desire to go back to intramuscular injections due to the pain on weekly intramuscular injections I did discuss oral testosterone and we could submit for approval if interested He would like to continue [place] for now but will consider oral testosterone and call back if he desires 48-month lab visit testosterone, hematocrit 1 year follow-up office visit with testosterone, hematocrit, PSA    8-month, MD  [facility] [address]  [address] [place], [place] [address] [phone]

## 2020-09-30 ENCOUNTER — Other Ambulatory Visit: Payer: Self-pay

## 2020-09-30 ENCOUNTER — Encounter: Payer: Self-pay | Admitting: Urology

## 2020-09-30 ENCOUNTER — Ambulatory Visit: Payer: BC Managed Care – PPO | Admitting: Urology

## 2020-09-30 VITALS — BP 149/85 | HR 80 | Ht 71.0 in | Wt 178.0 lb

## 2020-09-30 DIAGNOSIS — E291 Testicular hypofunction: Secondary | ICD-10-CM | POA: Diagnosis not present

## 2020-09-30 NOTE — Patient Instructions (Signed)
Oral testosterone [facility]

## 2020-10-09 ENCOUNTER — Other Ambulatory Visit: Payer: Self-pay | Admitting: Urology

## 2020-10-23 ENCOUNTER — Other Ambulatory Visit: Payer: Self-pay | Admitting: Urology

## 2020-11-19 ENCOUNTER — Ambulatory Visit: Payer: BC Managed Care – PPO | Attending: Family Medicine | Admitting: Physical Therapy

## 2020-11-19 DIAGNOSIS — M25551 Pain in right hip: Secondary | ICD-10-CM | POA: Diagnosis not present

## 2020-11-19 DIAGNOSIS — M25552 Pain in left hip: Secondary | ICD-10-CM | POA: Diagnosis present

## 2020-11-20 NOTE — Therapy (Signed)
[facility] [address] [place], [address] Phone: [phone]   Fax:  [phone]  Physical Therapy Evaluation  Patient Details  Name: Nicholas Sweeney MRN: [id number] Date of Birth: Mar 26, 1970 No data recorded  Encounter Date: 11/19/2020   PT End of Session - 11/19/20 0929     Visit Number 1    Number of Visits 17    Date for PT Re-Evaluation 01/14/21    Authorization - Visit Number 1    Progress Note Due on Visit 10    PT Start Time 0733    PT Stop Time 0815    PT Time Calculation (min) 42 min    Activity Tolerance Patient tolerated treatment well             Past Medical History:  Diagnosis Date   Hypogonadism in male    RA (rheumatoid arthritis) (HCC)     Past Surgical History:  Procedure Laterality Date   NO PAST SURGERIES      There were no vitals filed for this visit.    Subjective Assessment - 11/19/20 0734     Subjective Nicholas Sweeney is a 50 year old male presenting to therapy with primary c/o of bilateral anterior hip pain R>L. He reports pain has occured insideously approximately a year ago. 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 He reports his pain is worse in the morning and when transfering from sitting to standing. He reports his pain decreases his step length with walking. Pt reports his pain at best is 0/10 and 6-7/10 at worst.  Patient reports visiting a chriropractor and stretching with no relief. Pt would like to improve his ability to walk and sit as these limit full participation with his occupation. Pt denies any radiation of pain, saddle parathesia, loss of B&B function, unrelenting night pain, or falls in the last six months.    Limitations Sitting;Standing;Walking    How long can you sit comfortably? hours    How long can you stand comfortably? 15-51min    How long can you walk  comfortably? hours    Patient Stated Goals decrease pain in hips    Currently in Pain? Yes    Pain Score 0-No pain    Pain Location Hip    Pain Orientation Right;Left    Pain Descriptors / Indicators Aching    Pain Type Chronic pain    Pain Radiating Towards no    Pain Onset More than a month ago    Pain Frequency Intermittent    Aggravating Factors  first thing in the morning, and transfer after sitting in truck    Pain Relieving Factors stretching    Effect of Pain on Daily Activities limits ability to come from sitting to stand following short/long drives               Knee AROM   WNL bilaterally   Knee MMT L/R  Flex :4+/4+  Ext :5/5  Hip MMT : L/R Flex: 5/5 Ext: 5/5 Ir: 4+/5 Er: 4+/5 ABD 5/5  *no pain with resisted hip flexion bilaterally  Hip AROM  WFL in all motion except internal rotation limited approximately 50%  Hip PROM:  WFL in all motion except internal rotation limited approximately 25%  Special tests:  FABER:(-) FADDIR: (+) bilaterally  Distraction: (-)  Muscle Length  Thomas test (hip flexor): negative bilat  90/90 (hamstring):  approximately 10 deg bilat  Observations:  Gait: increased lateral sway,decreased step/stride length, nonantalgic   Palpation: Concordant pain and tenderness to R hip flexor with no signs of flinching   Squat: x 10 reps demonstrated increased lumbar flexion but no reproduction of pain  Posture: nearly normal only notable increased forward head and bilaterally rounded shoulders.  LE Sensation: intact bilaterally     Objective measurements completed on examination: See above findings.   PT reviewed the following HEP with patient with patient able to demonstrate a set of the following with min cuing for correction needed. PT educated patient on parameters of therex (how/when to inc/decrease intensity, frequency, rep/set range, stretch hold time, and purpose of therex) with verbalized understanding.  Education on hip flexor strengthening and posterior hip stretching to restore efficient length tension relationship of hip  Piriformis stretch 3 x 30 sec 7/week           PT Education - 11/20/20 0821     Education Details Patient was educated on diagnosis, anatomy and pathology involved, prognosis, role of PT, and was given an HEP, demonstrating exercise with proper form following verbal and tactile cues, and was given a paper hand out to continue exercise at home. Pt was educated on and agreed to plan of care.    Person(s) Educated Patient    Methods Explanation;Demonstration    Comprehension Verbalized understanding;Returned demonstration              PT Short Term Goals - 11/20/20 0844       PT SHORT TERM GOAL #1   Title Pt will be independent with HEP in order to self-manage hip pain.    Baseline HEP given    Time 4    Period Weeks    Status New    Target Date 12/18/20               PT Long Term Goals - 11/20/20 0813       PT LONG TERM GOAL #1   Title Patient will increase FOTO score to 80 to demonstrate predicted increase in functional mobility to complete ADLs    Baseline 11/19/20: 80    Time 8    Period Weeks    Status New    Target Date 01/15/21      PT LONG TERM GOAL #2   Title Pt will decrease worst pain as reported on NPRS by at least 3 points in order to demonstrate clinically significant reduction in pain.    Baseline 11/19/20: 7/10    Time 8    Period Weeks    Status New    Target Date 01/15/21                    Plan - 11/20/20 0756     Clinical Impression Statement Patient is a 50 y.o male presenting with bilateral anterior hip pain R>L. Pt demonstrates decreased ROM, abnormal gait, decreased strength, postural dysfunction, and pain. Activity limitations in sitting, standing, and walking limit patients ability to fully paticipate ADLs such as driving and performing occupational duties as a Naval architect. Pt will benefit from  skilled PT to address these impairments and achieve set goals.    Personal Factors and Comorbidities Time since onset of injury/illness/exacerbation    Examination-Activity Limitations Locomotion Level;Sit;Stand    Examination-Participation Restrictions Community Activity;Occupation;Driving    Stability/Clinical Decision Making Stable/Uncomplicated    Clinical Decision Making Low    Rehab Potential Good    PT Duration  8 weeks    PT Treatment/Interventions Cryotherapy;Moist Heat;Traction;Gait training;Stair training;Functional mobility training;Neuromuscular re-education;Balance training;Therapeutic exercise;Patient/family education;Manual techniques;ADLs/Self Care Home Management;Passive range of motion;Taping;Joint Manipulations;Therapeutic activities    PT Next Visit Plan assess single leg balance and hip flexors strength in various ranges    PT Home Exercise Plan piriformis stretch    Consulted and Agree with Plan of Care Patient             Patient will benefit from skilled therapeutic intervention in order to improve the following deficits and impairments:  Abnormal gait, Difficulty walking, Decreased range of motion, Postural dysfunction, Pain, Impaired flexibility, Decreased balance, Increased fascial restricitons, Decreased strength  Visit Diagnosis: Bilateral hip pain     Problem List Patient Active Problem List   Diagnosis Date Noted   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, [place], [address] Phone: [phone]   Fax:  [phone]  Name: Nicholas Sweeney MRN: [id number] Date of Birth: Nov 11, 1970

## 2020-11-24 ENCOUNTER — Ambulatory Visit: Payer: BC Managed Care – PPO | Admitting: Physical Therapy

## 2020-11-24 ENCOUNTER — Encounter: Payer: Self-pay | Admitting: Physical Therapy

## 2020-11-24 DIAGNOSIS — M25551 Pain in right hip: Secondary | ICD-10-CM

## 2020-11-24 NOTE — Therapy (Signed)
[facility] [address], [place], [address] Phone: [phone]   Fax:  [phone]  Physical Therapy Treatment  Patient Details  Name: Nicholas Sweeney MRN: [id number] Date of Birth: [birth year] No data recorded  Encounter Date: 11/24/2020   PT End of Session - 11/24/20 1035     Visit Number 2    Number of Visits 17    Date for PT Re-Evaluation 01/14/21    Authorization - Visit Number 2    Progress Note Due on Visit 10    PT Start Time 0731    PT Stop Time 0813    PT Time Calculation (min) 42 min    Activity Tolerance Patient tolerated treatment well    Behavior During Therapy [facility] for tasks assessed/performed             Past Medical History:  Diagnosis Date   Hypogonadism in male    RA (rheumatoid arthritis) (HCC)     Past Surgical History:  Procedure Laterality Date   NO PAST SURGERIES      There were no vitals filed for this visit.   Subjective Assessment - 11/24/20 0733     Subjective Pt reports that the stretches given as HEP have helped decrease his hip pain. He reports greatest pain as 3-4/10 on NPRS.    Limitations Sitting;Standing;Walking    How long can you sit comfortably? hours    How long can you stand comfortably? 15-40min    How long can you walk comfortably? hours    Patient Stated Goals decrease pain in hips            Therex  Recumbent back L3 x seat level 9   1 RM Leg Press: R 145# L 135#  Fwd Lunges 3 x 5 steps with increase lateral deviation and femoral control  Single leg stance >20 sec each LE  Hip Flexor MMT @ 0, 45, 90 deg hip flexion bilat R>L strength  Step ups onto 12in steps 1 x 10 with alternating LE with hip flexion on contralateral limb greater than 90 deg hip flexion with 10# AW  Half kneeling psoas stretch 3 x 10 sec BLE  Standing psoas stretch with foot on tall step 12" 3 x 10 sec  BLE                              PT Education - 11/24/20 1032     Education Details Pt educated on strengthening and updated stretches for improved ability to perform.    Person(s) Educated Patient    Methods Explanation;Demonstration    Comprehension Verbalized understanding;Returned demonstration              PT Short Term Goals - 11/20/20 0844       PT SHORT TERM GOAL #1   Title Pt will be independent with HEP in order to self-manage hip pain.    Baseline HEP given    Time 4    Period Weeks    Status New    Target Date 12/18/20               PT Long Term Goals - 11/20/20 0813       PT LONG TERM GOAL #1   Title Patient will increase FOTO score to 80 to demonstrate predicted increase in functional mobility to complete ADLs    Baseline 11/19/20: 80  Time 8    Period Weeks    Status New    Target Date 01/15/21      PT LONG TERM GOAL #2   Title Pt will decrease worst pain as reported on NPRS by at least 3 points in order to demonstrate clinically significant reduction in pain.    Baseline 11/19/20: 7/10    Time 8    Period Weeks    Status New    Target Date 01/15/21                   Plan - 11/24/20 1036     Clinical Impression Statement PT initiated hip flexor, hip stabilizing musculature, and mobility this session. Pt demonstrates asymmetrical hip flexor and LE weakness following 1 RM testing, forward lungeing, and hip flexion muscle testing. Pt does exhiibtPt will continue to benefit from skilled phyiscal therapy to address impairments and avhieve set goals.    Personal Factors and Comorbidities Time since onset of injury/illness/exacerbation    Examination-Activity Limitations Locomotion Level;Sit;Stand    Examination-Participation Restrictions Community Activity;Occupation;Driving    Stability/Clinical Decision Making Stable/Uncomplicated    Clinical Decision Making Low    Rehab Potential Good    PT Frequency 2x /  week    PT Duration 8 weeks    PT Treatment/Interventions Cryotherapy;Moist Heat;Traction;Gait training;Stair training;Functional mobility training;Neuromuscular re-education;Balance training;Therapeutic exercise;Patient/family education;Manual techniques;ADLs/Self Care Home Management;Passive range of motion;Taping;Joint Manipulations;Therapeutic activities    PT Next Visit Plan hip flexor/abd strengthening/mobility    PT Home Exercise Plan piriformis stretch    Consulted and Agree with Plan of Care Patient             Patient will benefit from skilled therapeutic intervention in order to improve the following deficits and impairments:  Abnormal gait, Difficulty walking, Decreased range of motion, Postural dysfunction, Pain, Impaired flexibility, Decreased balance, Increased fascial restricitons, Decreased strength  Visit Diagnosis: Bilateral hip pain     Problem List Patient Active Problem List   Diagnosis Date Noted   Ankle joint effusion 08/14/2018   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 [address] [place], [address] Phone: [phone]   Fax:  [phone]  Name: Nicholas Sweeney MRN: [id number] Date of Birth: 12-Jun-1970

## 2020-11-26 ENCOUNTER — Ambulatory Visit: Payer: BC Managed Care – PPO | Admitting: Physical Therapy

## 2020-11-30 ENCOUNTER — Encounter: Payer: BC Managed Care – PPO | Admitting: Physical Therapy

## 2020-12-02 ENCOUNTER — Encounter: Payer: BC Managed Care – PPO | Admitting: Physical Therapy

## 2020-12-03 ENCOUNTER — Encounter: Payer: BC Managed Care – PPO | Admitting: Physical Therapy

## 2020-12-08 ENCOUNTER — Ambulatory Visit: Payer: BC Managed Care – PPO | Attending: Family Medicine | Admitting: Physical Therapy

## 2020-12-10 ENCOUNTER — Encounter: Payer: BC Managed Care – PPO | Admitting: Physical Therapy

## 2020-12-15 ENCOUNTER — Encounter: Payer: BC Managed Care – PPO | Admitting: Physical Therapy

## 2020-12-17 ENCOUNTER — Encounter: Payer: BC Managed Care – PPO | Admitting: Physical Therapy

## 2020-12-17 ENCOUNTER — Ambulatory Visit: Payer: BC Managed Care – PPO | Admitting: Physical Therapy

## 2020-12-22 ENCOUNTER — Encounter: Payer: BC Managed Care – PPO | Admitting: Physical Therapy

## 2020-12-29 ENCOUNTER — Encounter: Payer: BC Managed Care – PPO | Admitting: Physical Therapy

## 2020-12-31 ENCOUNTER — Encounter: Payer: BC Managed Care – PPO | Admitting: Physical Therapy

## 2021-01-05 ENCOUNTER — Encounter: Payer: BC Managed Care – PPO | Admitting: Physical Therapy

## 2021-01-12 ENCOUNTER — Encounter: Payer: BC Managed Care – PPO | Admitting: Physical Therapy

## 2021-01-14 ENCOUNTER — Encounter: Payer: BC Managed Care – PPO | Admitting: Physical Therapy

## 2021-01-19 ENCOUNTER — Encounter: Payer: BC Managed Care – PPO | Admitting: Physical Therapy

## 2021-02-17 ENCOUNTER — Other Ambulatory Visit: Payer: Self-pay | Admitting: Urology

## 2021-02-19 ENCOUNTER — Encounter: Payer: Self-pay | Admitting: Urology

## 2021-03-30 ENCOUNTER — Other Ambulatory Visit: Payer: BC Managed Care – PPO

## 2021-03-30 ENCOUNTER — Other Ambulatory Visit: Payer: Self-pay

## 2021-03-30 DIAGNOSIS — E291 Testicular hypofunction: Secondary | ICD-10-CM

## 2021-03-31 LAB — TESTOSTERONE: Testosterone: 529 ng/dL (ref 264–916)

## 2021-03-31 LAB — HEMATOCRIT: Hematocrit: 46.9 % (ref 37.5–51.0)

## 2021-06-11 ENCOUNTER — Other Ambulatory Visit: Payer: Self-pay | Admitting: Urology

## 2021-09-24 ENCOUNTER — Other Ambulatory Visit: Payer: Self-pay

## 2021-09-24 DIAGNOSIS — E291 Testicular hypofunction: Secondary | ICD-10-CM

## 2021-09-27 ENCOUNTER — Other Ambulatory Visit: Payer: BC Managed Care – PPO

## 2021-09-27 DIAGNOSIS — E291 Testicular hypofunction: Secondary | ICD-10-CM

## 2021-09-28 LAB — HEMATOCRIT: Hematocrit: 49.7 % (ref 37.5–51.0)

## 2021-09-28 LAB — PSA: Prostate Specific Ag, Serum: 1.1 ng/mL (ref 0.0–4.0)

## 2021-09-28 LAB — TESTOSTERONE: Testosterone: 490 ng/dL (ref 264–916)

## 2021-10-01 ENCOUNTER — Encounter: Payer: Self-pay | Admitting: Urology

## 2021-10-01 ENCOUNTER — Ambulatory Visit: Payer: BC Managed Care – PPO | Admitting: Urology

## 2021-10-01 ENCOUNTER — Other Ambulatory Visit: Payer: Self-pay | Admitting: Urology

## 2021-10-01 VITALS — BP 126/85 | HR 85 | Ht 71.0 in | Wt 170.0 lb

## 2021-10-01 DIAGNOSIS — E291 Testicular hypofunction: Secondary | ICD-10-CM

## 2021-10-01 NOTE — Progress Notes (Signed)
   10/01/2021 9:06 AM   Nicholas Sweeney 29-Nov-1970 [id number]  Referring provider: Evelene Croon, MD [address] [place],  [place] [address]  Chief Complaint  Patient presents with   Hypogonadism    Urologic history: 1. Hypogonadism -TRT Xyosted    HPI: 51 y.o. male presents for annual follow-up of hypogonadism.  Remains on Xyosted 100 mg weekly Labs 09/01/2021: Testosterone 490 ng/dL; HCT 19.1 and PSA 4.7(WGNFAO level labs) No bothersome LUTS, does develop slight knots at sites of injections  PMH: Past Medical History:  Diagnosis Date   Hypogonadism in male    RA (rheumatoid arthritis) (HCC)     Surgical History: Past Surgical History:  Procedure Laterality Date   NO PAST SURGERIES      Home Medications:  Allergies as of 10/01/2021       Reactions   No Known Allergies         Medication List        Accurate as of October 01, 2021  9:06 AM. If you have any questions, ask your nurse or doctor.          STOP taking these medications    doxycycline 100 MG capsule Commonly known as: VIBRAMYCIN Stopped by: Riki Altes, MD   montelukast 10 MG tablet Commonly known as: SINGULAIR Stopped by: Riki Altes, MD       TAKE these medications    amphetamine-dextroamphetamine 10 MG tablet Commonly known as: ADDERALL Take by mouth.   fluticasone 50 MCG/ACT nasal spray Commonly known as: FLONASE INSTILL 2 SPRAYS INTO EACH NOSTRIL EVERY MORNING   meloxicam 15 MG tablet Commonly known as: MOBIC Take 15 mg by mouth daily.   Xyosted 100 MG/0.5ML Soaj Generic drug: Testosterone Enanthate INJECT 100 MG INTO THE SKIN ONCE A WEEK        Allergies:  Allergies  Allergen Reactions   No Known Allergies     Family History: Family History  Problem Relation Age of Onset   Benign prostatic hyperplasia Father    Cancer Mother    Prostate cancer Neg Hx    Kidney cancer Neg Hx    Bladder Cancer Neg Hx     Social History:  reports that  he has never smoked. He has never used smokeless tobacco. He reports current alcohol use. He reports that he does not use drugs.   Physical Exam: BP 126/85 (BP Location: Left Arm, Patient Position: Sitting, Cuff Size: Normal)   Pulse 85   Ht 5\' 11"  (1.803 m)   Wt 170 lb (77.1 kg)   BMI 23.71 kg/m   Constitutional:  Alert and oriented, No acute distress. HEENT: [place] AT, moist mucus membranes.  Trachea midline, no masses. Cardiovascular: No clubbing, cyanosis, or edema. Respiratory: Normal respiratory effort, no increased work of breathing. Neurologic: Grossly intact, no focal deficits, moving all 4 extremities. Psychiatric: Normal mood and affect.    Assessment & Plan:    1.  Hypogonadism Stable on TRT 6 month lab visit testosterone, hematocrit 1 year follow-up office visit with testosterone, hematocrit, PSA    , MD  [facility] [address], [address] [place], [place] [phone]

## 2021-10-01 NOTE — Addendum Note (Signed)
Addended by: Frankey Shown on: 10/01/2021 09:27 AM   Modules accepted: Orders

## 2021-10-04 ENCOUNTER — Encounter: Payer: Self-pay | Admitting: Urology

## 2021-10-05 ENCOUNTER — Other Ambulatory Visit: Payer: Self-pay | Admitting: Family Medicine

## 2021-10-06 ENCOUNTER — Other Ambulatory Visit: Payer: Self-pay | Admitting: Urology

## 2021-10-06 MED ORDER — XYOSTED 100 MG/0.5ML ~~LOC~~ SOAJ
SUBCUTANEOUS | 3 refills | Status: DC
Start: 2021-10-06 — End: 2022-01-26

## 2021-10-08 ENCOUNTER — Other Ambulatory Visit: Payer: Self-pay | Admitting: Urology

## 2022-01-23 ENCOUNTER — Other Ambulatory Visit: Payer: Self-pay | Admitting: Urology

## 2022-02-24 ENCOUNTER — Telehealth: Payer: Self-pay | Admitting: *Deleted

## 2022-02-24 NOTE — Telephone Encounter (Signed)
Pt calling stating that Nicholas Sweeney is no longer covered by his insurance the cash price is $600.00. Pt contacted his insurance and they preferred Testosterone Gel, per pt he tried that and he could not absorb the gel. Pt is asking if he start back taking the regular testosterone can it be increased?

## 2022-02-25 NOTE — Telephone Encounter (Signed)
The dose can be adjusted with monitoring

## 2022-02-28 ENCOUNTER — Encounter: Payer: Self-pay | Admitting: *Deleted

## 2022-03-01 ENCOUNTER — Encounter: Payer: Self-pay | Admitting: Urology

## 2022-03-04 NOTE — Telephone Encounter (Signed)
Spoke to patient and informed him that the PA through Cover My Meds was not working. I called the insurance company, they informed that that in the past Berdine Addison was on the formulary and this year it is not. I have faxed all information and low labs to the insurance company. I am waiting on the PA outcome.

## 2022-03-09 ENCOUNTER — Ambulatory Visit: Payer: BC Managed Care – PPO | Admitting: Physician Assistant

## 2022-03-09 VITALS — BP 112/73 | HR 88 | Ht 71.0 in

## 2022-03-09 DIAGNOSIS — E291 Testicular hypofunction: Secondary | ICD-10-CM | POA: Diagnosis not present

## 2022-03-09 MED ORDER — "BD ECLIPSE SHIELDED NEEDLE 18G X 1-1/2"" MISC": 1 refills | Status: DC

## 2022-03-09 MED ORDER — TESTOSTERONE CYPIONATE 200 MG/ML IM SOLN: 100.0000 mg | INTRAMUSCULAR | 0 refills | Status: DC

## 2022-03-09 MED ORDER — SYRINGE 2-3 ML 3 ML MISC: 2 refills | Status: DC

## 2022-03-09 MED ORDER — "BD ECLIPSE NEEDLE 21G X 1-1/2"" MISC": 1 refills | Status: DC

## 2022-03-09 NOTE — Progress Notes (Signed)
03/09/2022 5:03 PM   Nicholas Sweeney May 21, 1970 [id number]  CC: Chief Complaint  Patient presents with   discuss testosterone treatment   HPI: Nicholas Sweeney is a 52 y.o. male with PMH hypogonadism well-managed on Xyosted 100 mg weekly who presents today to discuss testosterone therapy.   His insurance stopped covering Xyosted at the beginning of this year.  His last injection was about 3 weeks ago.  An appeal is pending.  He previously tried and failed testosterone gel.  We discussed various alternatives including intramuscular injections, oral therapies, long-acting injections, and Testopel.  Of these, he is most interested in resuming intramuscular injections.  He notes that when he was previously on IM testosterone cypionate, he dosed every 14 days and noticed low energy and mood toward the end of every 2 weeks.  He notes that his moods and energy levels are more stable with weekly dosing on Xyosted.  We are planning to move forward with transitioning him to weekly testosterone cypionate intramuscular injections, however before leaving clinic he heard from his insurance that his appeal had been approved.  PMH: Past Medical History:  Diagnosis Date   Hypogonadism in male    RA (rheumatoid arthritis) ([place])     Surgical History: Past Surgical History:  Procedure Laterality Date   NO PAST SURGERIES      Home Medications:  Allergies as of 03/09/2022       Reactions   No Known Allergies         Medication List        Accurate as of March 09, 2022  5:03 PM. If you have any questions, ask your nurse or doctor.          2-3CC SYRINGE 3 ML Misc Use one syringe every 2 weeks with testosterone injection. Started by: Debroah Loop, PA-C   amphetamine-dextroamphetamine 10 MG tablet Commonly known as: ADDERALL Take by mouth.   BD Eclipse Needle 21G X 1-1/2" Misc Generic drug: NEEDLE (DISP) 21 G Use one needle every 2 weeks to inject testosterone  into the muscle. Started by: Debroah Loop, PA-C   BD Eclipse Shielded Needle 18G X 1-1/2" Misc Generic drug: NEEDLE (DISP) 18 G Use one needle every 2 weeks to draw the medication up into the syringe prior to injection. Started by: Debroah Loop, PA-C   fluticasone 50 MCG/ACT nasal spray Commonly known as: FLONASE INSTILL 2 SPRAYS INTO EACH NOSTRIL EVERY MORNING   meloxicam 15 MG tablet Commonly known as: MOBIC Take 15 mg by mouth daily.   testosterone cypionate 200 MG/ML injection Commonly known as: DEPOTESTOSTERONE CYPIONATE Inject 0.5 mLs (100 mg total) into the muscle every 7 (seven) days. Started by: Debroah Loop, PA-C   Xyosted 100 MG/0.5ML Soaj Generic drug: Testosterone Enanthate INJECT 100 MG INTO THE SKIN ONCE A WEEK        Allergies:  Allergies  Allergen Reactions   No Known Allergies     Family History: Family History  Problem Relation Age of Onset   Benign prostatic hyperplasia Father    Cancer Mother    Prostate cancer Neg Hx    Kidney cancer Neg Hx    Bladder Cancer Neg Hx     Social History:   reports that he has never smoked. He has never used smokeless tobacco. He reports current alcohol use. He reports that he does not use drugs.  Physical Exam: BP 112/73   Pulse 88   Ht 5\' 11"  (1.803 m)   BMI 23.71 kg/m  Constitutional:  Alert and oriented, no acute distress, nontoxic appearing HEENT: [place], AT Cardiovascular: No clubbing, cyanosis, or edema Respiratory: Normal respiratory effort, no increased work of breathing Skin: No rashes, bruises or suspicious lesions Neurologic: Grossly intact, no focal deficits, moving all 4 extremities Psychiatric: Normal mood and affect  Assessment & Plan:   1. Hypogonadism in male Previously failed testosterone gel.  Does well with weekly versus biweekly injections.  Fortunately, his insurance appeal has been approved so we will continue with Xyosted 100 mg weekly.  If further  insurance issues in the future, may consider resuming testosterone cypionate intramuscular injections, recommend every 7 days dosing.  Return if symptoms worsen or fail to improve.  Debroah Loop, PA-C  [name] [address], [place], [place] [address] [phone]

## 2022-03-28 ENCOUNTER — Encounter: Payer: Self-pay | Admitting: Urology

## 2022-04-01 ENCOUNTER — Other Ambulatory Visit: Payer: BC Managed Care – PPO

## 2022-04-06 ENCOUNTER — Other Ambulatory Visit: Payer: BC Managed Care – PPO

## 2022-04-06 DIAGNOSIS — E291 Testicular hypofunction: Secondary | ICD-10-CM

## 2022-04-07 LAB — TESTOSTERONE: Testosterone: 434 ng/dL (ref 264–916)

## 2022-04-07 LAB — HEMATOCRIT: Hematocrit: 50.8 % (ref 37.5–51.0)

## 2022-04-15 ENCOUNTER — Encounter: Payer: Self-pay | Admitting: *Deleted

## 2022-05-31 ENCOUNTER — Other Ambulatory Visit: Payer: Self-pay | Admitting: Urology

## 2022-09-02 ENCOUNTER — Other Ambulatory Visit: Payer: BC Managed Care – PPO

## 2022-09-21 ENCOUNTER — Other Ambulatory Visit: Payer: Self-pay | Admitting: Urology

## 2022-09-21 ENCOUNTER — Other Ambulatory Visit: Payer: BC Managed Care – PPO

## 2022-09-21 DIAGNOSIS — E291 Testicular hypofunction: Secondary | ICD-10-CM

## 2022-09-22 LAB — HEMATOCRIT: Hematocrit: 48 % (ref 37.5–51.0)

## 2022-09-22 LAB — PSA: Prostate Specific Ag, Serum: 3.6 ng/mL (ref 0.0–4.0)

## 2022-09-22 LAB — TESTOSTERONE: Testosterone: 716 ng/dL (ref 264–916)

## 2022-10-06 ENCOUNTER — Ambulatory Visit: Payer: BC Managed Care – PPO | Admitting: Urology

## 2022-10-06 VITALS — BP 134/90 | HR 94 | Ht 71.0 in | Wt 175.0 lb

## 2022-10-06 DIAGNOSIS — E291 Testicular hypofunction: Secondary | ICD-10-CM | POA: Diagnosis not present

## 2022-10-06 NOTE — Progress Notes (Signed)
   I,Dina M Abdulla,acting as a scribe for Riki Altes, MD.,have documented all relevant documentation on the behalf of Riki Altes, MD,as directed by  Riki Altes, MD while in the presence of Riki Altes, MD.  10/06/2022 9:14 AM   Nicholas Sweeney 1970/06/23 [id number]  Referring provider: Evelene Croon, MD [place] [address] [place],  [place] [address]  Chief Complaint  Patient presents with   Follow-up    Urologic history:  1. Hypogonadism -TRT Xyosted 100 mg weekly    HPI: 52 y.o. male presents for annual follow-up of hypogonadism.  Doing well since last visit No bothersome LUTS Denies dysuria, gross hematuria Denies flank, abdominal or pelvic pain Labs 09/21/22 testosterone 716, hematocrit 48.0, PSA 3.6  PMH: Past Medical History:  Diagnosis Date   Hypogonadism in male    RA (rheumatoid arthritis) (HCC)     Surgical History: Past Surgical History:  Procedure Laterality Date   NO PAST SURGERIES      Home Medications:  Allergies as of 10/06/2022       Reactions   No Known Allergies         Medication List        Accurate as of October 06, 2022  9:14 AM. If you have any questions, ask your nurse or doctor.          amphetamine-dextroamphetamine 10 MG tablet Commonly known as: ADDERALL Take by mouth.   fluticasone 50 MCG/ACT nasal spray Commonly known as: FLONASE INSTILL 2 SPRAYS INTO EACH NOSTRIL EVERY MORNING   meloxicam 15 MG tablet Commonly known as: MOBIC Take 15 mg by mouth daily.   Xyosted 100 MG/0.5ML Soaj Generic drug: Testosterone Enanthate INJECT 100 MG INTO THE SKIN ONCE A WEEK        Allergies:  Allergies  Allergen Reactions   No Known Allergies     Family History: Family History  Problem Relation Age of Onset   Benign prostatic hyperplasia Father    Cancer Mother    Prostate cancer Neg Hx    Kidney cancer Neg Hx    Bladder Cancer Neg Hx     Social History:  reports that he has never smoked. He  has never used smokeless tobacco. He reports current alcohol use. He reports that he does not use drugs.   Physical Exam: BP (!) 134/90   Pulse 94   Ht 5\' 11"  (1.803 m)   Wt 175 lb (79.4 kg)   BMI 24.41 kg/m   Constitutional:  Alert and oriented, No acute distress. HEENT: [place] AT, moist mucus membranes.  Trachea midline, no masses. Cardiovascular: No clubbing, cyanosis, or edema. Respiratory: Normal respiratory effort, no increased work of breathing. Neurologic: Grossly intact, no focal deficits, moving all 4 extremities. Psychiatric: Normal mood and affect.    Assessment & Plan:    1.  Hypogonadism Stable on TRT PSA elevated above baseline at 3.6. We will recheck in 3 months as this may be a transient elevation. 6 month lab visit testosterone, hematocrit 1 year follow-up office visit with testosterone, hematocrit, PSA  I have reviewed the above documentation for accuracy and completeness, and I agree with the above.   Riki Altes, MD  [facility] [address] [place], [place] [address] [phone]

## 2022-10-07 ENCOUNTER — Encounter: Payer: Self-pay | Admitting: Urology

## 2022-12-15 ENCOUNTER — Other Ambulatory Visit: Payer: BC Managed Care – PPO

## 2022-12-16 ENCOUNTER — Other Ambulatory Visit: Payer: BC Managed Care – PPO

## 2022-12-16 DIAGNOSIS — E291 Testicular hypofunction: Secondary | ICD-10-CM

## 2022-12-17 LAB — PSA: Prostate Specific Ag, Serum: 1.5 ng/mL (ref 0.0–4.0)

## 2023-01-20 ENCOUNTER — Encounter: Payer: Self-pay | Admitting: Urology

## 2023-01-20 ENCOUNTER — Other Ambulatory Visit: Payer: Self-pay | Admitting: *Deleted

## 2023-01-20 MED ORDER — XYOSTED 100 MG/0.5ML ~~LOC~~ SOAJ: SUBCUTANEOUS | 3 refills | Status: DC

## 2023-02-20 ENCOUNTER — Encounter: Payer: Self-pay | Admitting: Urology

## 2023-02-23 ENCOUNTER — Telehealth: Payer: BC Managed Care – PPO | Admitting: Urology

## 2023-02-23 NOTE — Telephone Encounter (Signed)
Pt called and wants to know if we can call [facility] on [address] in [place] to see if they have started the prior auth for his injections. Pt states he had called [facility] and they said they have sent it over quite a few times. I explained to pt that we have not received anything as of yet. Please advise patient.

## 2023-02-23 NOTE — Telephone Encounter (Signed)
I will have to call patient insurance  to get this medication approved. Printed out this cards today .

## 2023-02-23 NOTE — Telephone Encounter (Signed)
Talked with patient to day and I told him I can [facility] this afternoon to found out what going on .

## 2023-02-28 ENCOUNTER — Telehealth: Payer: Self-pay | Admitting: *Deleted

## 2023-02-28 NOTE — Telephone Encounter (Signed)
Called [phone] today and started prior  auth . I faxed over office notes and labs to [phone] . They states it can take up to four days to get results . Case number [id number]

## 2023-02-28 NOTE — Telephone Encounter (Signed)
Pt called again requesting a callback ASAP regarding medication refill and status of approval from insurance. Please advise patient.

## 2023-04-05 ENCOUNTER — Encounter: Payer: Self-pay | Admitting: Urology

## 2023-04-06 ENCOUNTER — Other Ambulatory Visit: Payer: BC Managed Care – PPO

## 2023-04-12 ENCOUNTER — Other Ambulatory Visit: Payer: Self-pay

## 2023-04-12 DIAGNOSIS — E291 Testicular hypofunction: Secondary | ICD-10-CM

## 2023-04-13 ENCOUNTER — Encounter: Payer: Self-pay | Admitting: Urology

## 2023-04-13 ENCOUNTER — Other Ambulatory Visit

## 2023-04-13 DIAGNOSIS — E291 Testicular hypofunction: Secondary | ICD-10-CM

## 2023-04-14 ENCOUNTER — Encounter: Payer: Self-pay | Admitting: Urology

## 2023-04-14 LAB — HEMATOCRIT: Hematocrit: 52.5 % — ABNORMAL HIGH (ref 37.5–51.0)

## 2023-04-14 LAB — TESTOSTERONE: Testosterone: 649 ng/dL (ref 264–916)

## 2023-05-25 ENCOUNTER — Other Ambulatory Visit: Payer: Self-pay | Admitting: Urology

## 2023-05-26 ENCOUNTER — Encounter: Payer: Self-pay | Admitting: Urology

## 2023-07-20 ENCOUNTER — Encounter: Payer: Self-pay | Admitting: Urology

## 2023-09-21 ENCOUNTER — Encounter: Payer: Self-pay | Admitting: Urology

## 2023-09-21 ENCOUNTER — Other Ambulatory Visit: Payer: Self-pay | Admitting: Urology

## 2023-09-28 ENCOUNTER — Other Ambulatory Visit: Payer: Self-pay

## 2023-09-28 DIAGNOSIS — E291 Testicular hypofunction: Secondary | ICD-10-CM

## 2023-09-29 ENCOUNTER — Other Ambulatory Visit

## 2023-10-03 ENCOUNTER — Ambulatory Visit: Admitting: Urology

## 2023-10-03 ENCOUNTER — Other Ambulatory Visit

## 2023-10-03 ENCOUNTER — Other Ambulatory Visit: Payer: BC Managed Care – PPO

## 2023-10-03 DIAGNOSIS — E291 Testicular hypofunction: Secondary | ICD-10-CM

## 2023-10-04 LAB — PSA: Prostate Specific Ag, Serum: 0.9 ng/mL (ref 0.0–4.0)

## 2023-10-04 LAB — HEMATOCRIT: Hematocrit: 50.7 % (ref 37.5–51.0)

## 2023-10-04 LAB — TESTOSTERONE: Testosterone: 354 ng/dL (ref 264–916)

## 2023-10-05 ENCOUNTER — Ambulatory Visit: Payer: Self-pay | Admitting: Urology

## 2023-10-10 ENCOUNTER — Encounter: Payer: Self-pay | Admitting: Urology

## 2023-10-10 ENCOUNTER — Ambulatory Visit: Admitting: Urology

## 2023-10-10 VITALS — BP 147/87 | HR 84 | Ht 71.0 in | Wt 175.0 lb

## 2023-10-10 DIAGNOSIS — E291 Testicular hypofunction: Secondary | ICD-10-CM | POA: Diagnosis not present

## 2023-10-10 MED ORDER — TESTOSTERONE CYPIONATE 200 MG/ML IM SOLN
100.0000 mg | INTRAMUSCULAR | 0 refills | Status: DC
Start: 2023-10-10 — End: 2023-11-22

## 2023-10-10 NOTE — Progress Notes (Signed)
   10/10/2023 10:35 AM   Nicholas Sweeney 1970/04/25 [id number]  Referring provider: No referring provider defined for this encounter.  Chief Complaint  Patient presents with   Follow-up   Urologic history:  1. Hypogonadism -TRT Xyosted  100 mg weekly   HPI: Nicholas Sweeney is a 53 y.o. male presents for annual follow-up  Has had problems getting his [facility]ed  from [facility].  He states they do not keep it in stock and have to order and it is chronically delayed.  Has not had an injection in 4 weeks.  His prescription was refilled on 09/23/2023 He is interested in Testopel  No bothersome LUTS Denies dysuria, gross hematuria Denies flank, abdominal or pelvic pain Labs 10/03/2023 testosterone  354 ng/dL, hematocrit 49.2, PSA 0.9  PMH: Past Medical History:  Diagnosis Date   Hypogonadism in male    RA (rheumatoid arthritis) (HCC)     Surgical History: Past Surgical History:  Procedure Laterality Date   NO PAST SURGERIES      Home Medications:  Allergies as of 10/10/2023       Reactions   No Known Allergies         Medication List        Accurate as of October 10, 2023 10:35 AM. If you have any questions, ask your nurse or doctor.          STOP taking these medications    Xyosted  100 MG/0.5ML Soaj Generic drug: Testosterone  Enanthate Stopped by: Glendia JAYSON Barba       TAKE these medications    amphetamine-dextroamphetamine 10 MG tablet Commonly known as: ADDERALL Take by mouth.   fluticasone 50 MCG/ACT nasal spray Commonly known as: FLONASE INSTILL 2 SPRAYS INTO EACH NOSTRIL EVERY MORNING   meloxicam 15 MG tablet Commonly known as: MOBIC Take 15 mg by mouth daily.   testosterone  cypionate 200 MG/ML injection Commonly known as: DEPOTESTOSTERONE CYPIONATE Inject 0.5 mLs (100 mg total) into the muscle once a week. Started by: Glendia JAYSON Barba        Allergies:  Allergies  Allergen Reactions   No Known Allergies     Family History: Family  History  Problem Relation Age of Onset   Benign prostatic hyperplasia Father    Cancer Mother    Prostate cancer Neg Hx    Kidney cancer Neg Hx    Bladder Cancer Neg Hx     Social History:  reports that he has never smoked. He has never used smokeless tobacco. He reports current alcohol use. He reports that he does not use drugs.   Physical Exam: BP (!) 147/87   Pulse 84   Ht 5' 11 (1.803 m)   Wt 175 lb (79.4 kg)   BMI 24.41 kg/m   Constitutional:  Alert, No acute distress. HEENT: [place] AT Respiratory: Normal respiratory effort, no increased work of breathing. Psychiatric: Normal mood and affect.   Assessment & Plan:    1.  Hypogonadism Will pursue prior authorization Testopel  Rx testosterone  cypionate 100 mg weekly was sent to pharmacy.  His wife is a CMA and will administer his injections until he can get started on Testopel    Glendia JAYSON Barba, MD  [facility] [address] [place], [place] [address] [phone]

## 2023-10-20 ENCOUNTER — Telehealth: Payer: Self-pay

## 2023-10-20 NOTE — Telephone Encounter (Signed)
 Nicholas Sweeney [id number] 1970/03/22   Provider- Clotilda Cornwall   Procedure Rniz:88019 Drug Code:S0189    Insurance contacted - Pa initiated         Auth number: [id number].   Information send to [facility], waiting on response   Approval dates : __________________   Denied:_________________________

## 2023-10-27 ENCOUNTER — Telehealth: Payer: Self-pay

## 2023-10-27 NOTE — Telephone Encounter (Signed)
 Kalyn from [employer] called triage line requesting a early am T level, dose and frequency of testopel  faxed to [phone].    Will send to [place] for f/u.

## 2023-10-31 NOTE — Telephone Encounter (Signed)
 Documents faxed to [employer].

## 2023-11-09 ENCOUNTER — Encounter: Payer: Self-pay | Admitting: Urology

## 2023-11-09 NOTE — Telephone Encounter (Signed)
 Per Dr.Stoioff, pt can be scheduled 1 week after last testosterone  injection, pt scheduled for 10/24, pt aware of appt. Pt wanted to know if he could get the more testopel  per visit or come in more frequently. Please advise.

## 2023-11-21 NOTE — Progress Notes (Unsigned)
    He presents today for Testopel  insertion.  Identified upper outer quadrant of left hip for insertion; prepped area with Betadine and injected 10 cc's of Lidocaine 2% with Epinephrine to anesthetize superficially and distally along trocar tract.  Made 3 mm incision using 11 blade of scalpel; trocar with sharp ended stylet was inserted into subcutaneous tissue in line with femur. Sharp stylet was withdrawn and 6 pellets were placed into trocar well. Testopel  pellets advanced into tissue using blunt ended stylet. Trocar removed and incision closed using 6 Steri-Strips. Cleansed area to remove Betadine and covered Steri-Strips with outer Band-Aid.  Careful inspection of insertion is done and patient informed of post procedure instructions.  Advised patient to apply ice to the site for 20-30 minutes every hour if needed.  Avoid hot tubes, swimming or full water immersion of the insertion site for 72 hours.  Bandage may be removed after one week.    Patient is advised to contact the office if experiencing drainage of the insertion site, excessive redness or swelling of the site, chills and/or fevers > 101.5, nausea or vomiting, dizziness or lightheadedness and excessive tenderness.  Avoid strenuous activity and heavy lifting for 72 hours.     He will return in one month for serum testosterone , hemoglobin and hematocrit.

## 2023-11-22 ENCOUNTER — Ambulatory Visit: Admitting: Urology

## 2023-11-22 VITALS — BP 125/85 | HR 63 | Ht 71.0 in | Wt 175.0 lb

## 2023-11-22 DIAGNOSIS — E291 Testicular hypofunction: Secondary | ICD-10-CM

## 2023-11-22 MED ORDER — LIDOCAINE-EPINEPHRINE 1 %-1:100000 IJ SOLN: 5.0000 mL | Freq: Once | INTRAMUSCULAR | Status: AC

## 2023-11-22 MED ORDER — TESTOSTERONE 75 MG IL PLLT
450.0000 mg | PELLET | Freq: Once | Status: AC
  Administered 2023-11-22: 450 mg

## 2023-11-22 NOTE — Patient Instructions (Signed)
cting testosterone pellet available ? ? ?  ?

## 2023-11-22 NOTE — Addendum Note (Signed)
 Addended by: CLEOTILDE HELLER L on: 11/22/2023 11:00 AM   Modules accepted: Orders

## 2023-11-24 ENCOUNTER — Ambulatory Visit: Admitting: Urology

## 2023-12-21 ENCOUNTER — Other Ambulatory Visit

## 2023-12-21 ENCOUNTER — Other Ambulatory Visit: Payer: Self-pay

## 2023-12-21 DIAGNOSIS — E291 Testicular hypofunction: Secondary | ICD-10-CM

## 2023-12-22 ENCOUNTER — Other Ambulatory Visit

## 2023-12-22 DIAGNOSIS — E291 Testicular hypofunction: Secondary | ICD-10-CM

## 2023-12-23 LAB — HEMOGLOBIN AND HEMATOCRIT, BLOOD
Hematocrit: 48.1 % (ref 37.5–51.0)
Hemoglobin: 16.4 g/dL (ref 13.0–17.7)

## 2023-12-23 LAB — TESTOSTERONE: Testosterone: 523 ng/dL (ref 264–916)

## 2023-12-25 ENCOUNTER — Ambulatory Visit: Payer: Self-pay | Admitting: Urology

## 2024-02-12 ENCOUNTER — Other Ambulatory Visit: Payer: Self-pay

## 2024-02-12 DIAGNOSIS — E291 Testicular hypofunction: Secondary | ICD-10-CM

## 2024-02-23 ENCOUNTER — Other Ambulatory Visit

## 2024-02-23 DIAGNOSIS — E291 Testicular hypofunction: Secondary | ICD-10-CM

## 2024-02-28 ENCOUNTER — Ambulatory Visit: Payer: Self-pay | Admitting: Urology

## 2024-02-28 ENCOUNTER — Telehealth: Payer: Self-pay | Admitting: Urology

## 2024-02-28 LAB — TESTOSTERONE: Testosterone: 263 ng/dL — ABNORMAL LOW (ref 264–916)

## 2024-02-28 NOTE — Telephone Encounter (Signed)
 [place] with [employer] lvm regarding patient's Testosterone  medication. He has insurance with [employer] as of 02-01-24 (insurance is updated in patient's chart). They need a prior authorization request, as they don't have anything on file for him. Contact phone number for [employer] is [phone]. Per [employer] rep ([place]), she asked that patient be called and updated when this has been done.

## 2024-03-06 ENCOUNTER — Telehealth: Payer: Self-pay

## 2024-03-06 NOTE — Telephone Encounter (Signed)
 Called ([employer]) patients insurance to start a new prior authorization spoke with Baker report number #[id number]. Baker stated that I would need to fax over patient lab records showing proof of low testosterone  under 300ng/dL. I faxed that to [phone] she said that it will take up to five days to be approved. I provided our call back number and sent a MyChart message to patient with a update of his prior authorization status.- Kanya Potteiger,CMA.
